# Patient Record
Sex: Female | Born: 1940 | Race: White | Hispanic: No | Smoking: Never smoker
Health system: Southern US, Academic
[De-identification: ages and names within clinical notes are randomized; demographics above are authoritative.]

## PROBLEM LIST (undated history)

## (undated) DIAGNOSIS — S72142A Displaced intertrochanteric fracture of left femur, initial encounter for closed fracture: Secondary | ICD-10-CM

## (undated) DIAGNOSIS — F419 Anxiety disorder, unspecified: Secondary | ICD-10-CM

## (undated) DIAGNOSIS — E7800 Pure hypercholesterolemia, unspecified: Secondary | ICD-10-CM

## (undated) DIAGNOSIS — E559 Vitamin D deficiency, unspecified: Secondary | ICD-10-CM

## (undated) DIAGNOSIS — I10 Essential (primary) hypertension: Secondary | ICD-10-CM

## (undated) DIAGNOSIS — J449 Chronic obstructive pulmonary disease, unspecified: Secondary | ICD-10-CM

## (undated) DIAGNOSIS — I499 Cardiac arrhythmia, unspecified: Secondary | ICD-10-CM

## (undated) HISTORY — DX: Hypomagnesemia: E83.42

## (undated) HISTORY — PX: BRONCHOSCOPY: SUR163

## (undated) HISTORY — PX: HX HIP REPLACEMENT: SHX124

## (undated) HISTORY — PX: HX CYSTOCELE REPAIR: SHX163

## (undated) HISTORY — DX: Vitamin D deficiency, unspecified: E55.9

## (undated) HISTORY — DX: Displaced intertrochanteric fracture of left femur, initial encounter for closed fracture: S72.142A

## (undated) HISTORY — PX: HX CARPAL TUNNEL RELEASE: SHX101

---

## 1999-04-28 ENCOUNTER — Other Ambulatory Visit (HOSPITAL_COMMUNITY): Payer: Self-pay | Admitting: OBSTETRICS/GYNECOLOGY

## 2019-09-01 IMAGING — MR MRI LUMBAR SPINE WO CONTRAST
4 of 7 series · 17 of 48 positions shown · non-contrast
Comparison: [id number]

EXAM: MRI LUMBAR SPINE WITHOUT CONTRAST.
INDICATION: Radiculopathy, back pain.
TECHNIQUE: MR imaging of the lumbar spine was performed without contrast.

[Series 401: T2 · sagittal · 4.0mm · 0.50mm/px · 5 of 15 slices shown (1 of 3)]
[im 1/15]
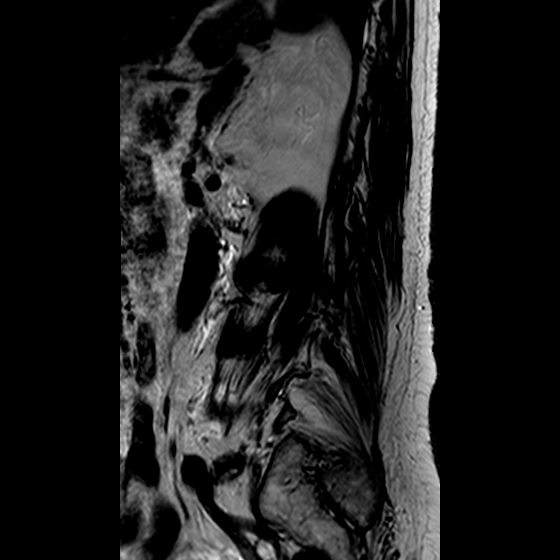
[im 4/15]
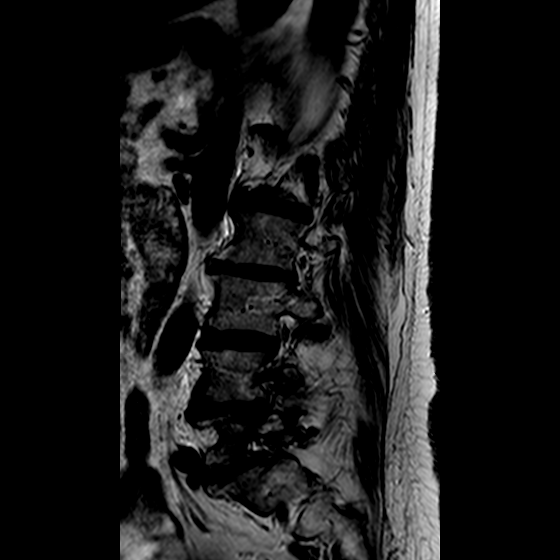
[im 8/15]
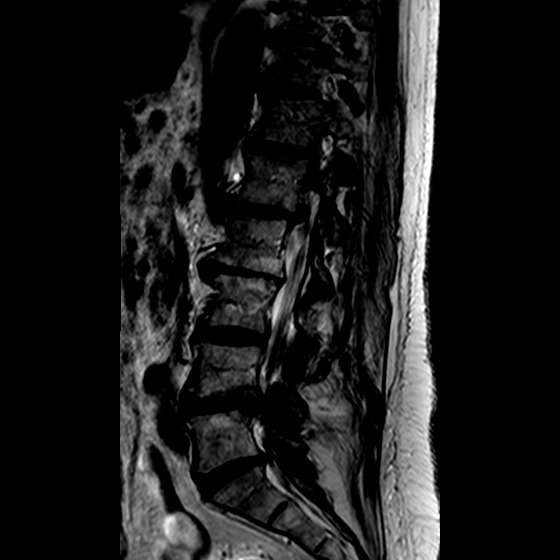
[im 11/15]
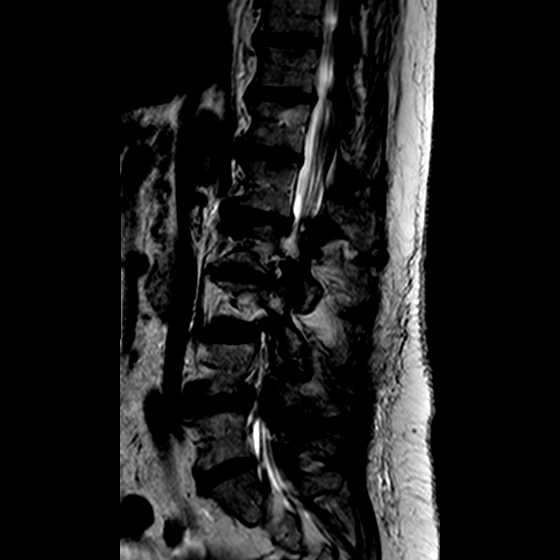
[im 15/15]
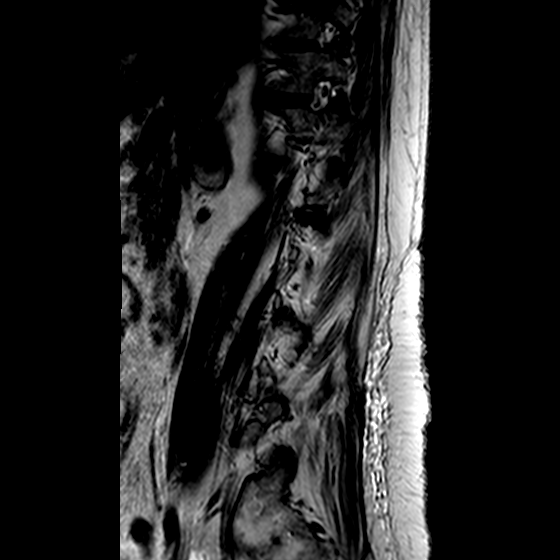

[Series 501: T1 · sagittal · 4.0mm · 0.49mm/px · 3 of 15 slices shown]
[im 1/15]
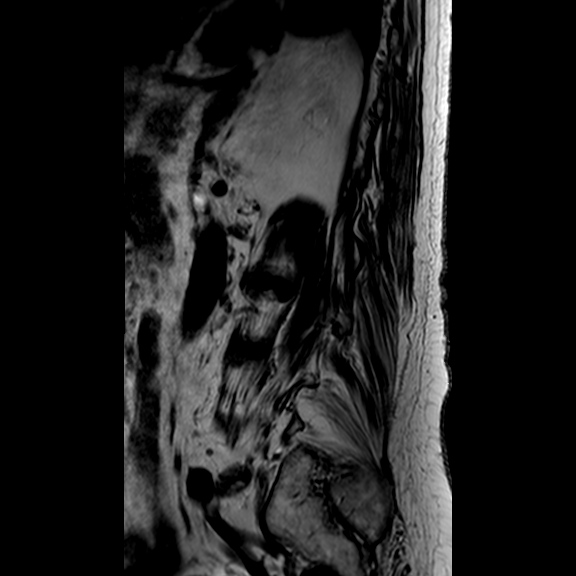
[im 8/15]
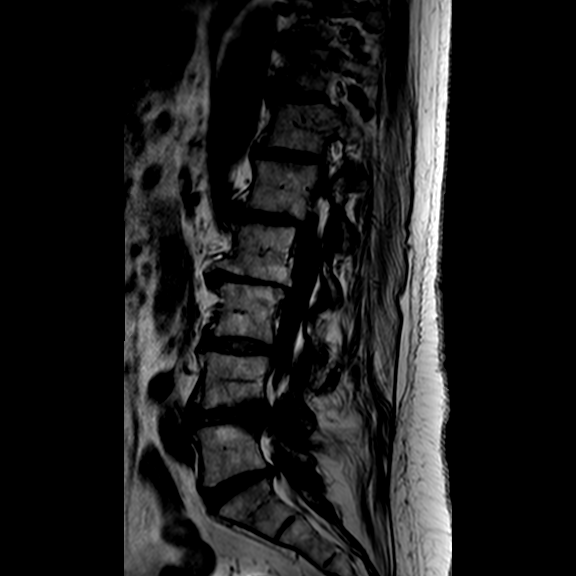
[im 15/15]
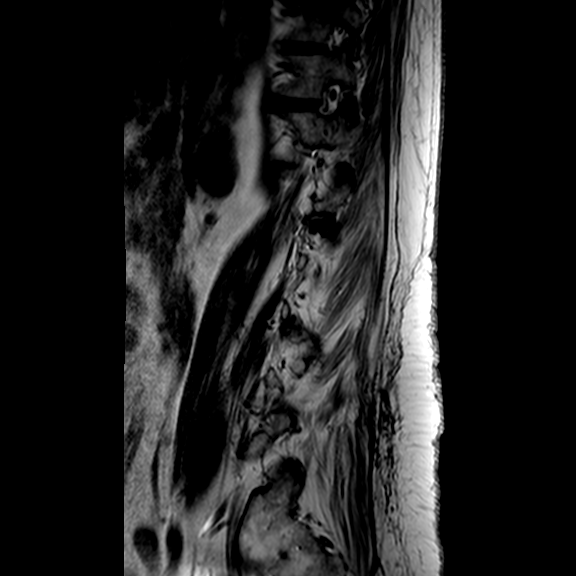

[Series 701: T2 · coronal · 4.0mm · 0.49mm/px · 6 of 19 slices shown (2 of 3)]
[im 1/19]
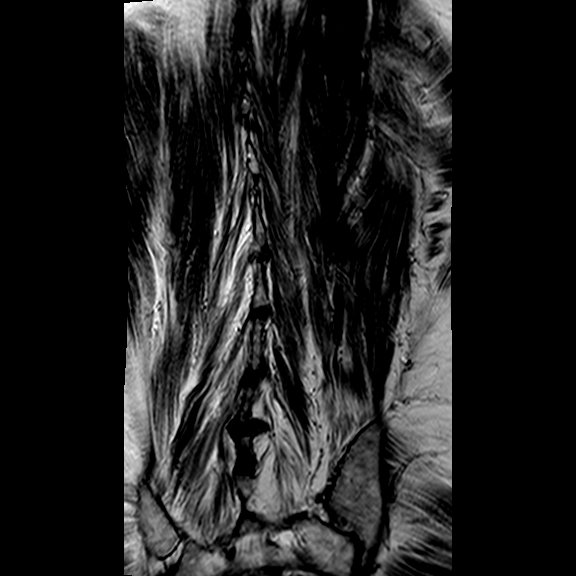
[im 4/19]
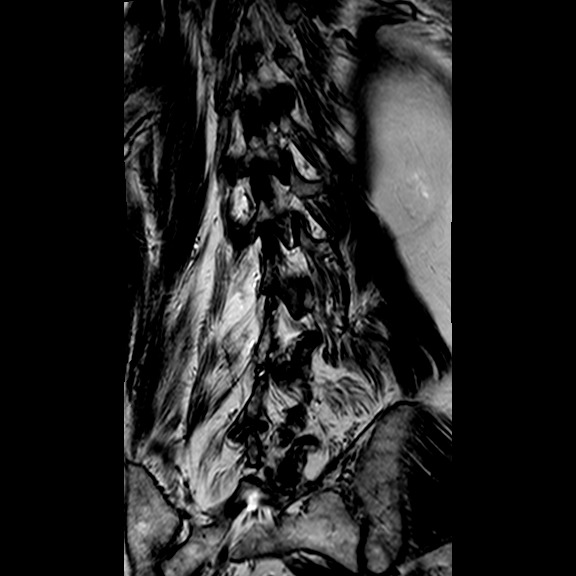
[im 7/19]
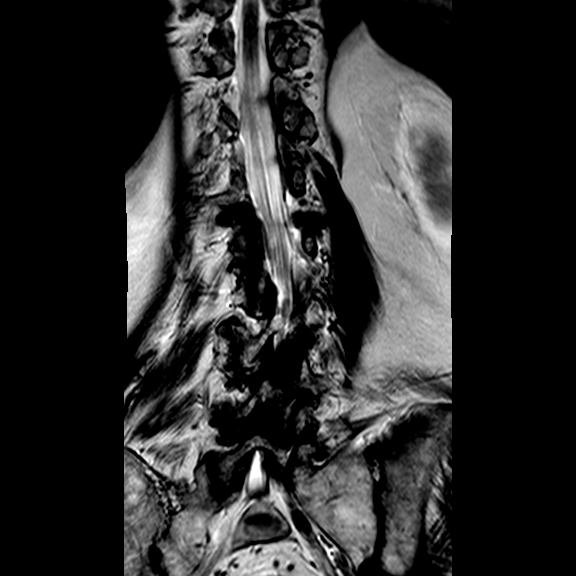
[im 10/19]
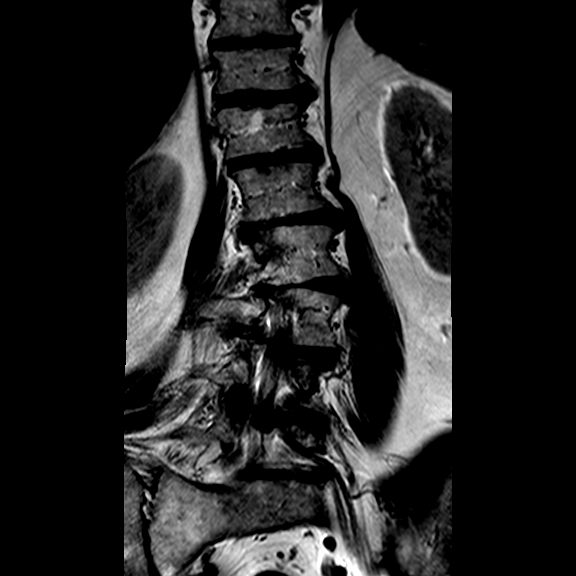
[im 13/19]
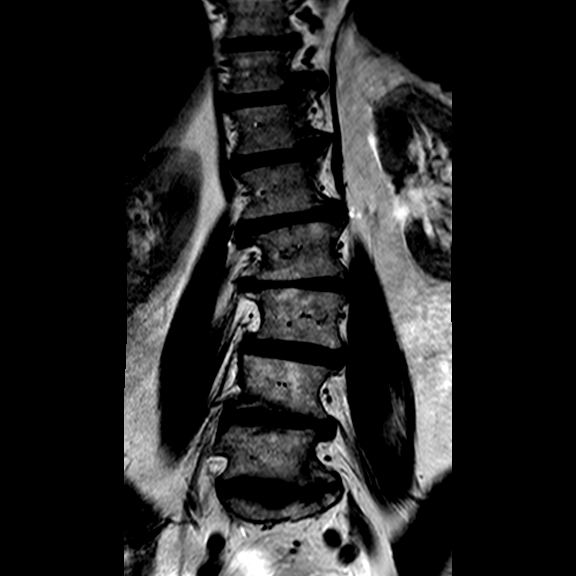
[im 16/19]
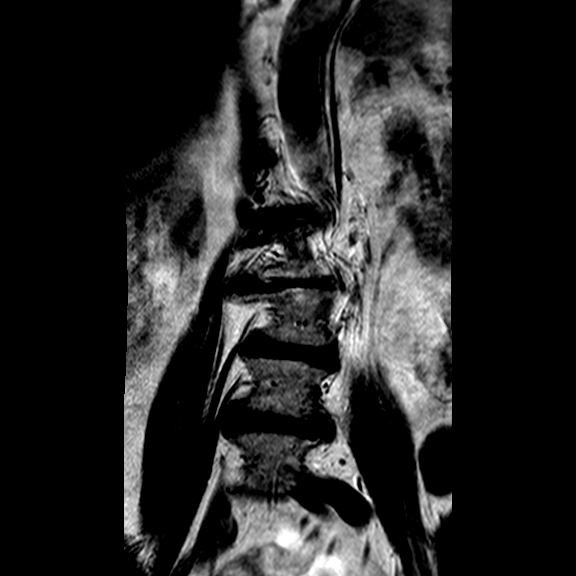

[Series 801: T2 · axial · 4.0mm · 0.49mm/px · z∈[-124,+27]mm · 3 of 30 slices shown (3 of 3)]
[im 3/30]
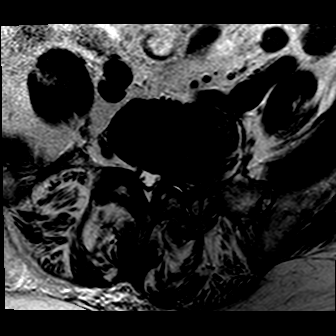
[im 15/30]
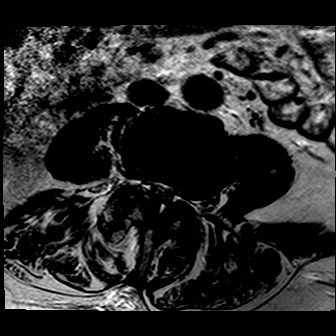
[im 27/30]
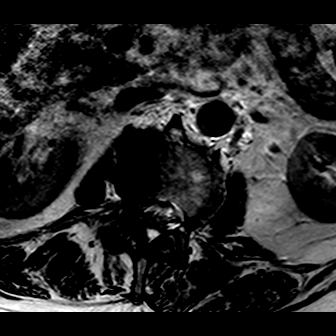

[17 of 48 positions shown; findings below may reference images not displayed]

FINDINGS: For the purposes of this report, the most caudal well-formed disc space is designated as L5-S1.
There is moderate levorotatory scoliosis centered at L2. Mild to moderate exaggeration of lordosis with 3 mm retrolisthesis of L4 on L5.  Vertebral body heights are preserved. There is marrow edema along the right L4-L5 dorsal endplates and left L4 and L5 pedicles, in keeping with type I marrow degenerative changes.
The conus is normal in signal and caliber, terminating at T12-L1.
L4-L5 Baastrup's disease changes are present.
T10-L1: Only visualized on sagittal views with no spinal canal stenosis. There is moderate lower thoracic facet arthropathy with mild left neural foraminal narrowing from T10-T12.
L1-L2: Moderate disc height loss with osteophytosis and moderate facet arthropathy. Mild to moderate spinal canal stenosis. Moderate right foraminal narrowing.
T12-L1: Moderate disc height loss with osteophytosis and mild to moderate spinal canal stenosis. Mild facet arthropathy. Severe right and mild left foraminal narrowing.
L3-L4: Mild disc height loss with bulging of the disc and severe hypertrophic facet arthropathy. Moderate spinal canal stenosis. Severe right and mild to moderate left foraminal narrowing.
L4-L5: Moderate disc height loss with osteophytosis and a superimposed central disc extrusion with associated 9 mm caudal extension. Severe spinal canal stenosis. Moderate hypertrophic facet arthropathy. Severe bilateral foraminal narrowing.
L5-S1: Moderate disc height loss with osteophytosis asymmetric to the left. Severe far lateral left foraminal narrowing. Moderate to severe left subarticular narrowing and mild right subarticular narrowing. No spinal canal stenosis. Mild facet arthropathy.
IMPRESSION: 1.  Scoliosis, hyperlordosis, and multilevel spondylosis with severe spinal canal stenosis at L4-L5.
2.  Multilevel advanced lateralizing stenosis.
LOCATION CODE: 1

## 2019-10-02 IMAGING — CT CT CHEST PULMONARY EMBOLISM W IV CONTRAST
2 of 5 series · 13 of 36 positions shown · non-contrast
Comparison: none

CTA OF THE CHEST PULMONARY EMBOLUS PROTOCOL:
CLINICAL INDICATION:  PE suspected, intermediate prob, positive D-dimer
REFERENCE:  None
TECHNIQUE: Informed consent was obtained for the administration of contrast.  Following the timed administration of intravenous contrast, axial imaging was obtained through the central portion of the chest to include the pulmonary arteries.  Additional delayed full chest imaging was then obtained.  Subsequent coronal slab MIP reconstruction was obtained.  No untoward contrast reaction was reported.

[Series 2: pe chest · axial · 0.98mm/px · z∈[-269,+1]mm · 10 of 132 slices shown]
[im 12/132  lung]
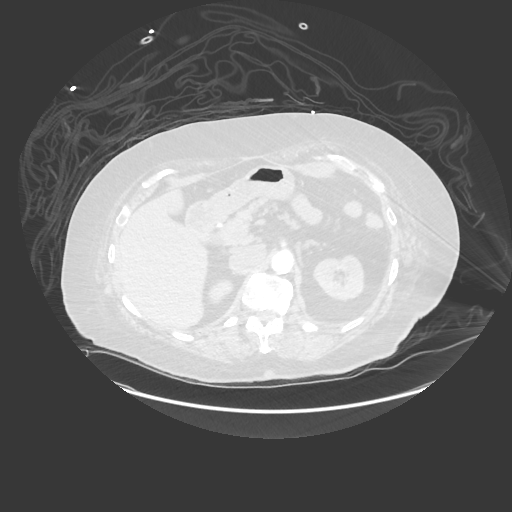
[im 24/132  mediastinal]
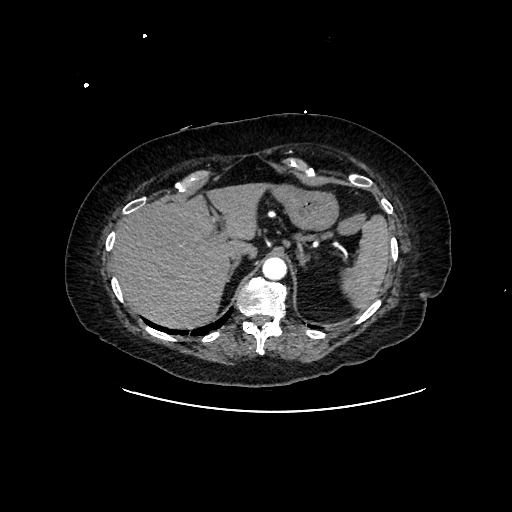
[im 36/132  lung]
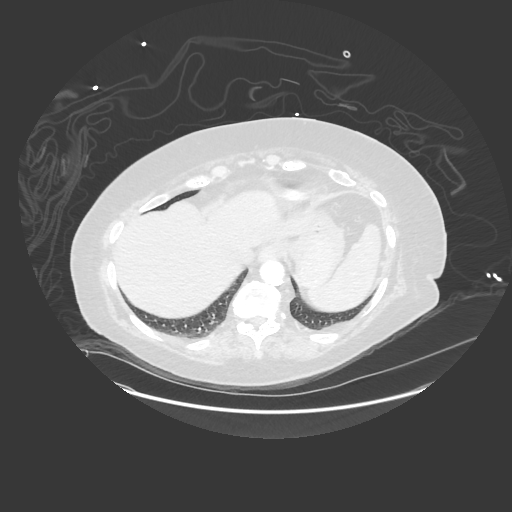
[im 48/132  mediastinal]
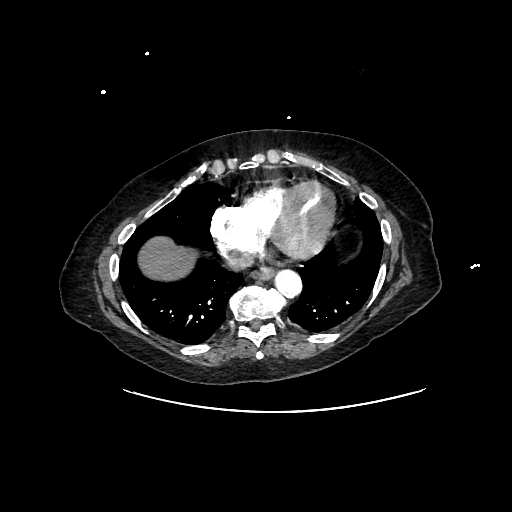
[im 60/132  lung]
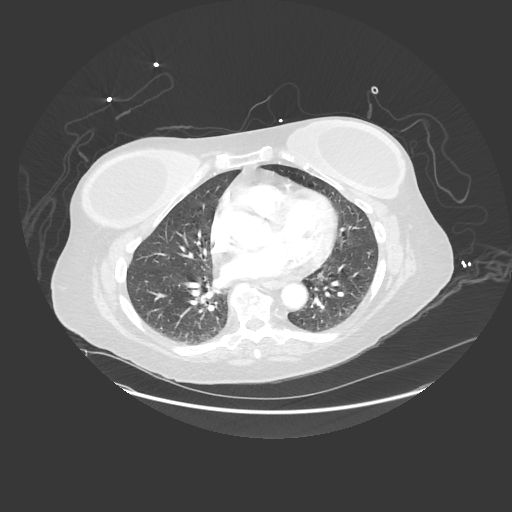
[im 72/132  mediastinal]
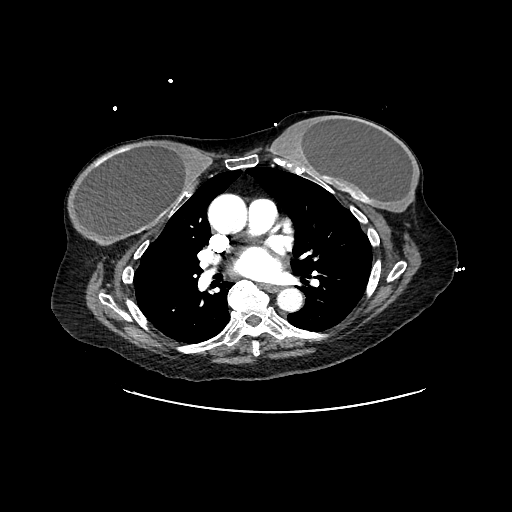
[im 84/132  lung]
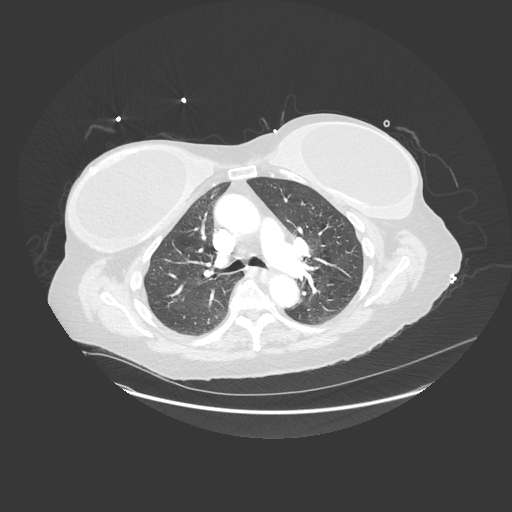
[im 96/132  mediastinal]
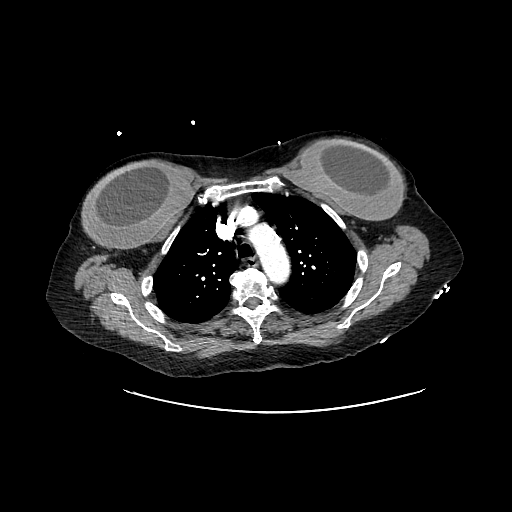
[im 108/132  lung]
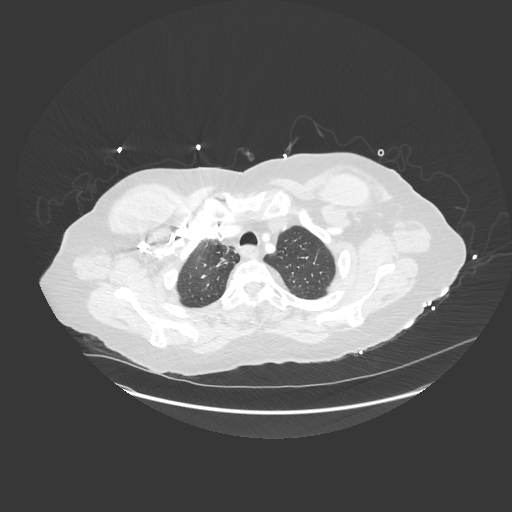
[im 120/132  mediastinal]
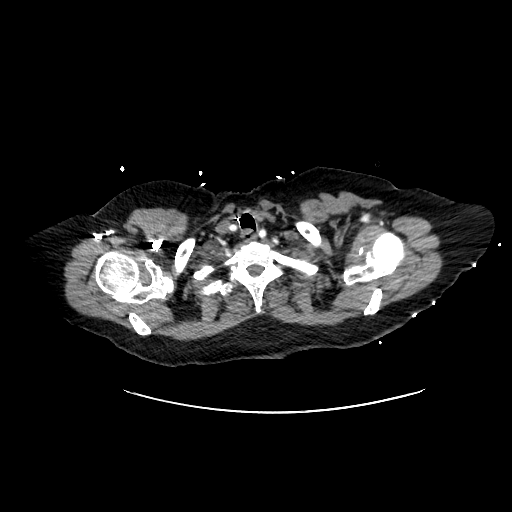

[Series 601: sag standard 2x2 · sagittal · 0.98mm/px · 3 of 241 slices shown]
[im 49/241  mediastinal]
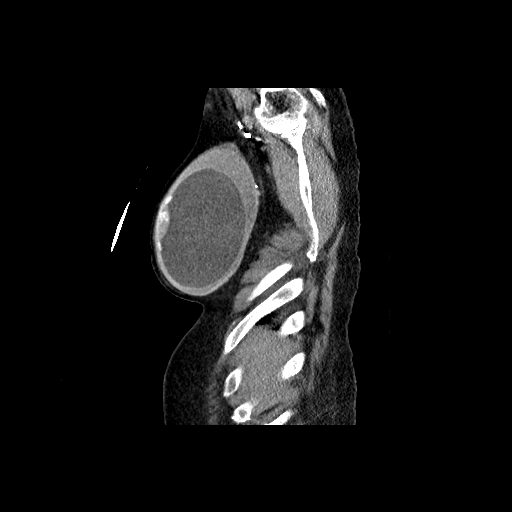
[im 97/241  mediastinal]
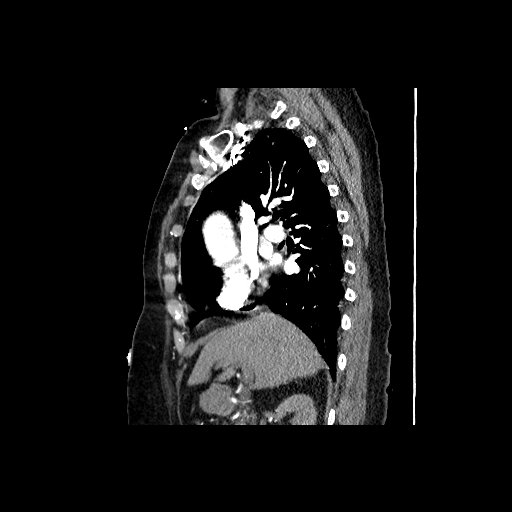
[im 145/241  mediastinal]
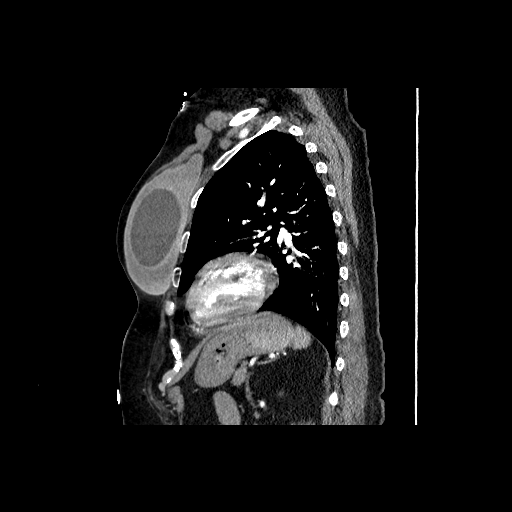

[13 of 36 positions shown; findings below may reference images not displayed]

FINDINGS: There are bilateral breast implants. The heart is normal in size. There is no pericardial effusion. There is minimal aortic mural calcification but no aneurysm or dissection. The pulmonary arteries are normal in caliber. There are no filling defects to suggest the presence of pulmonary arterial emboli.
The left lobe of the thyroid gland appears normal. The right lobe of thyroid gland is surgically absent. The esophagus appears normal. There is no threshold adenopathy in the chest.
Lung windows reveal a patent proximal tracheobronchial tree. There is minimal atelectasis in the lingula. The lungs are otherwise clear with no consolidation, pleural effusion, pneumothorax or suspicious mass.
Limited views of the upper abdomen demonstrate no acute abnormalities. There is a low-attenuation soft tissue density structure posterior to the inferior vena cava measuring 3.6 x 1.8 cm and 29 Hounsfield units, of indeterminate etiology but stable and considered a benign etiology. There is a subcentimeter cyst in the head of the pancreas, stable.
Bone windows reveal multilevel spurs or syndesmophytes.
IMPRESSION: 1. No visible pulmonary arterial embolus.
2. Minimal atelectasis in the lingula.
3. No visible pneumonia, pleural effusion or pneumothorax.
All CT scans at this facility use dose modulation, iterative reconstruction, and/or weight-based dosing when appropriate to reduce radiation dose to as low as reasonably achievable.
LOCATION CODE: 4

## 2019-10-02 IMAGING — DX XR CHEST 1 VIEW
1 series · 1 of 1 positions shown · non-contrast
Comparison: none

SINGLE PORTABLE CHEST:
CLINICAL INDICATION: chest pain
REFERENCE: 03/07/2015

[AP]
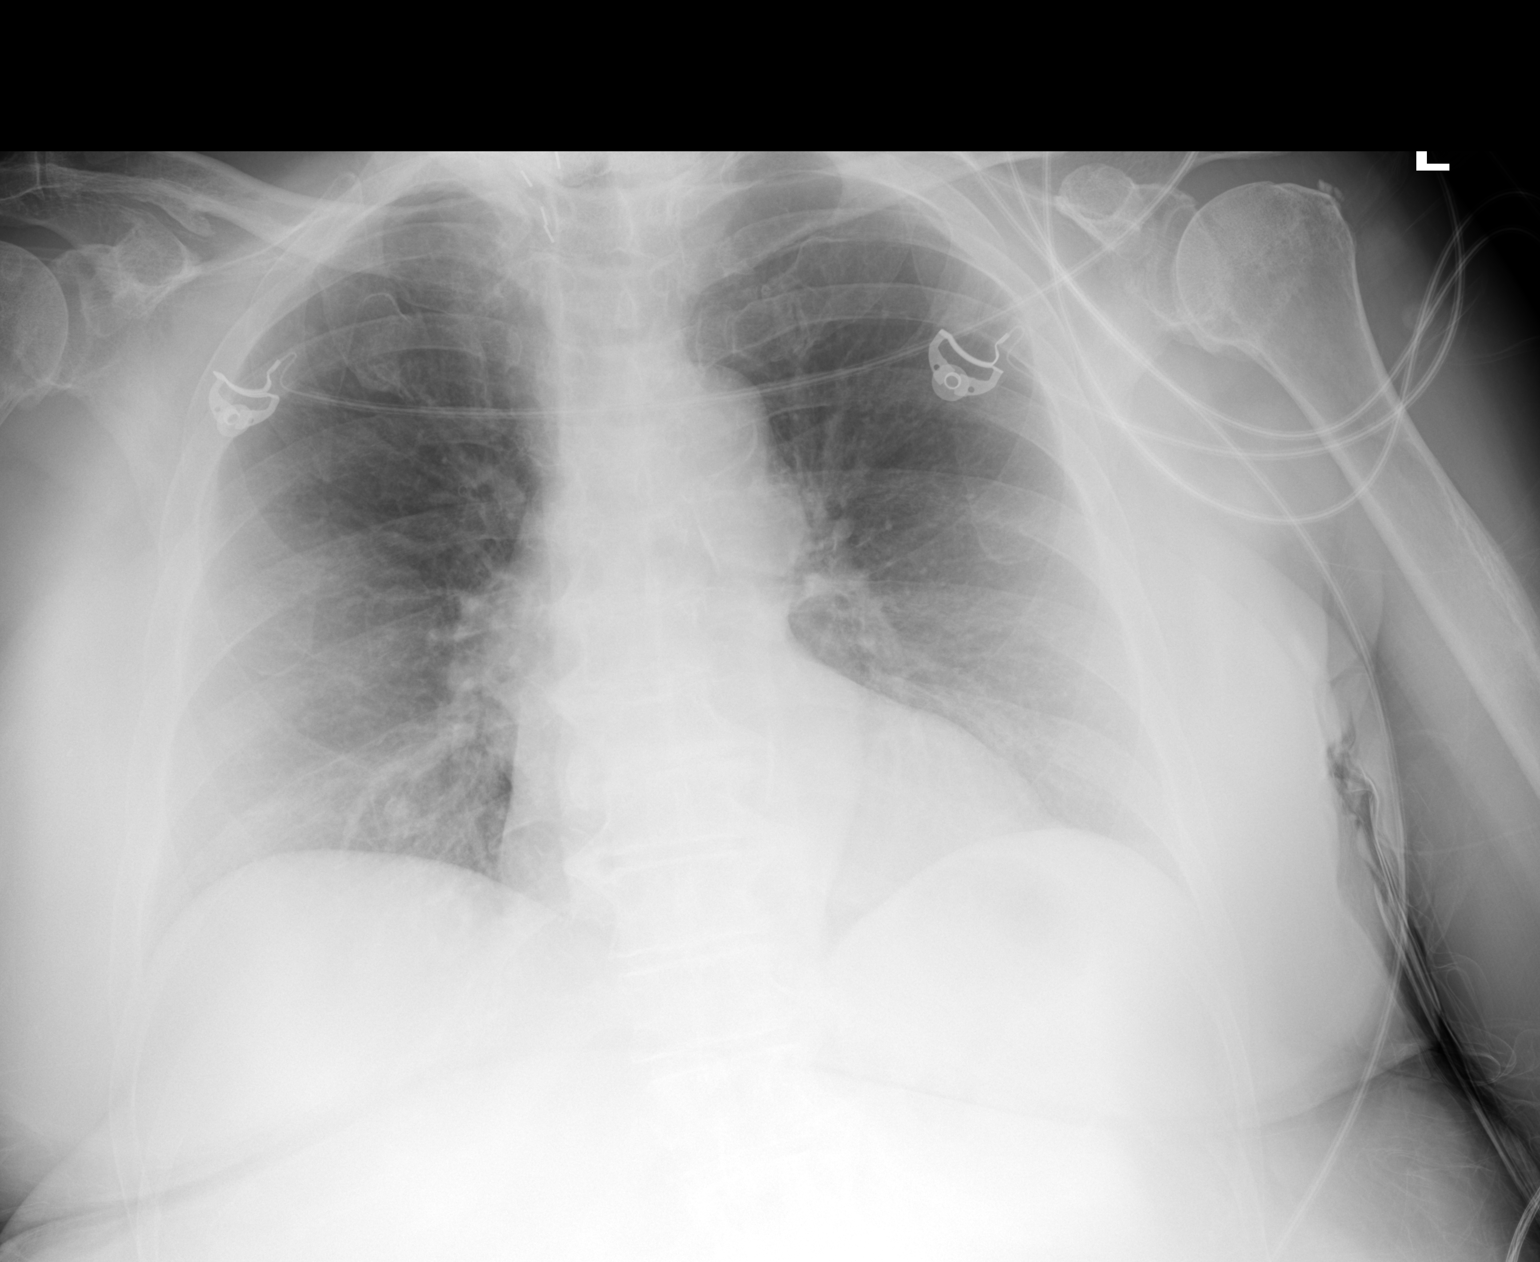

[1 of 1 positions shown; findings below may reference images not displayed]

FINDINGS: The lungs are fully expanded and clear with no consolidation, pleural effusion, pneumothorax or mass.
The heart is normal in size. The pulmonary vascularity is normal. There are no visible mediastinal masses.
There are multilevel small vertebral body spurs or syndesmophytes. There is calcific tendinitis of the left shoulder.
IMPRESSION: No acute cardiopulmonary disease.
LOCATION CODE: 4

## 2019-10-21 IMAGING — CT CT HEAD WO CONTRAST
1 series · 15 of 30 positions shown, 19 images · non-contrast
Comparison: Noncontrast CT brain from 03/07/2015

CT OF THE HEAD WITHOUT CONTRAST
CLINICAL INDICATION: head injury
TECHNIQUE: Multiple axial images were obtained through the brain without the administration of contrast.

[Series 2: head stnd · axial · 0.49mm/px · z∈[-5,+163]mm · 15 of 36 slices shown, 19 images]
[im 2/36  brain]
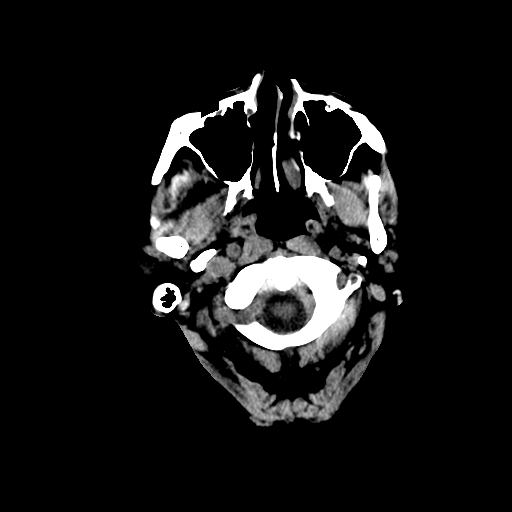
[im 2/36  bone]
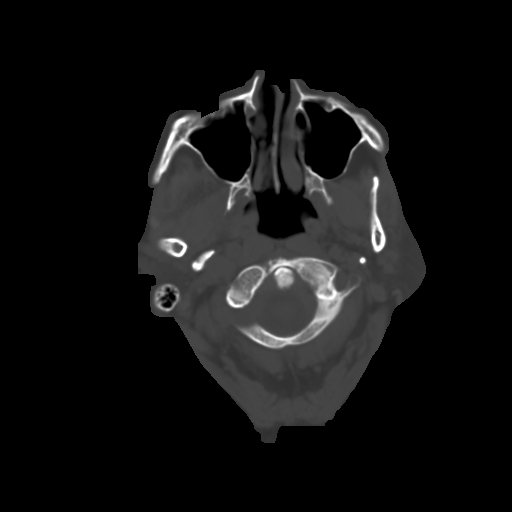
[im 4/36  brain]
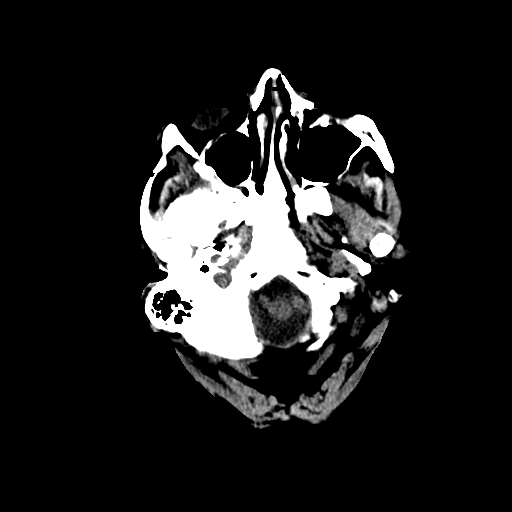
[im 7/36  brain]
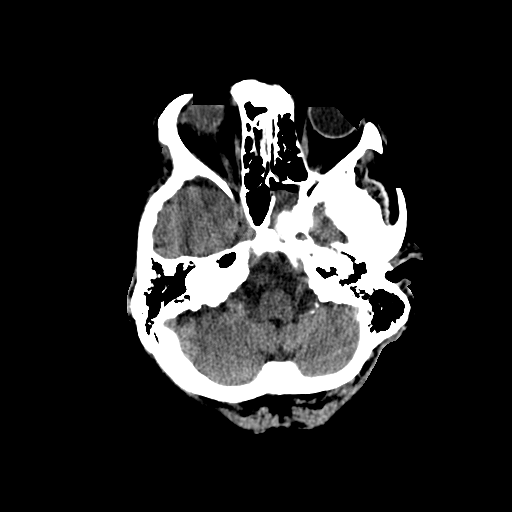
[im 9/36  brain]
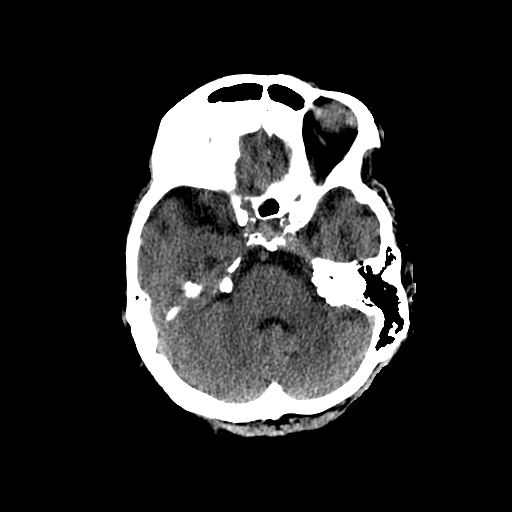
[im 11/36  brain]
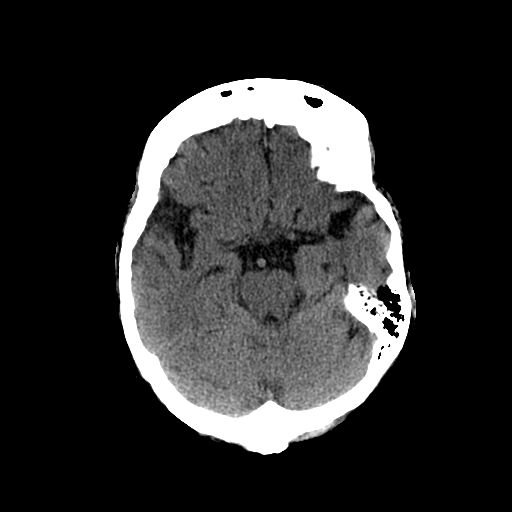
[im 11/36  bone]
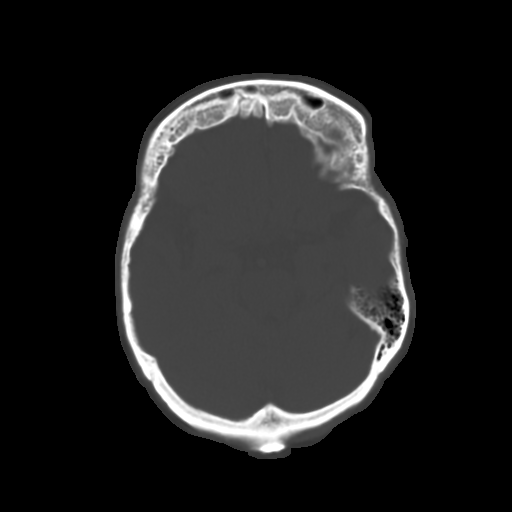
[im 14/36  brain]
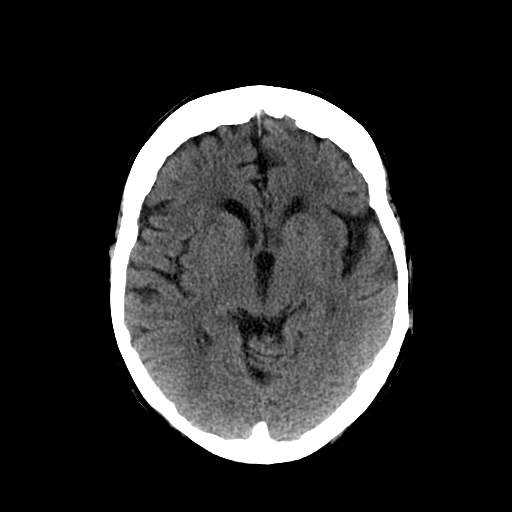
[im 16/36  brain]
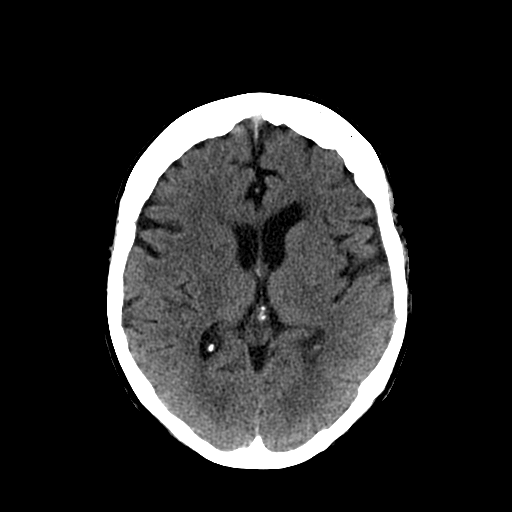
[im 19/36  brain]
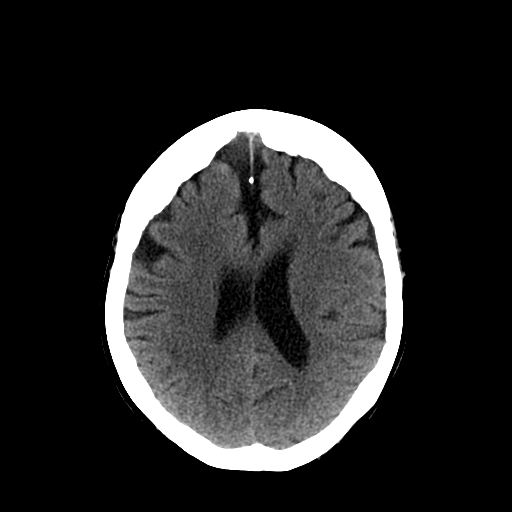
[im 20/36  brain]
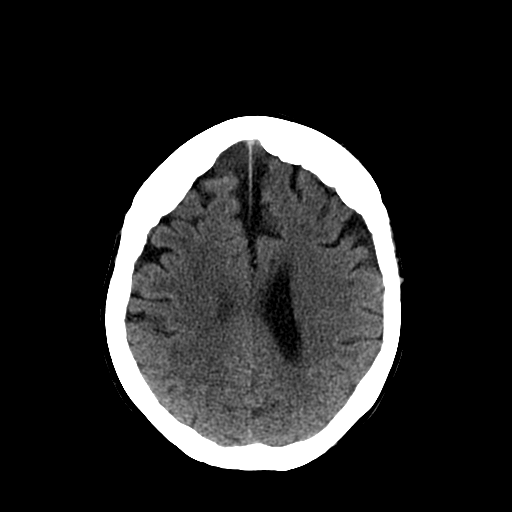
[im 20/36  bone]
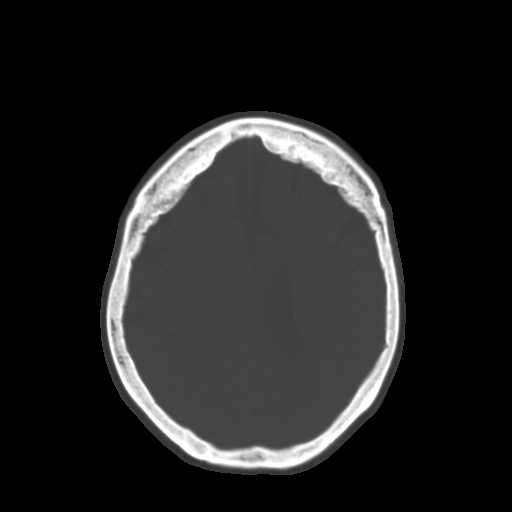
[im 22/36  brain]
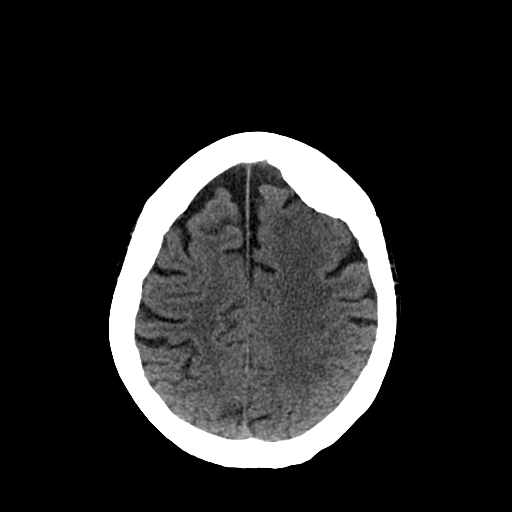
[im 25/36  brain]
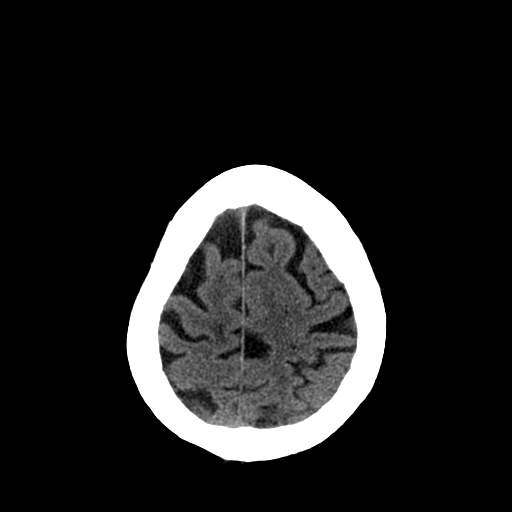
[im 27/36  brain]
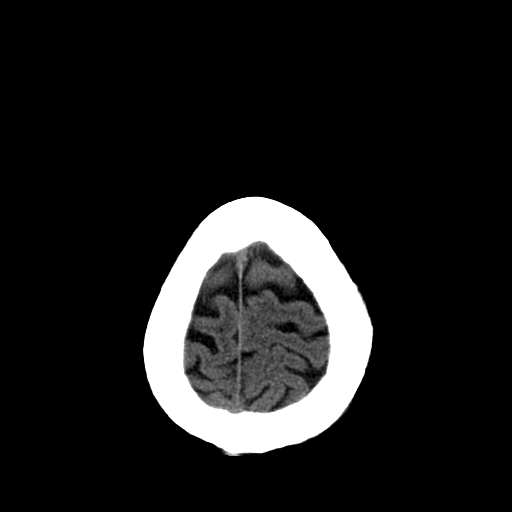
[im 29/36  brain]
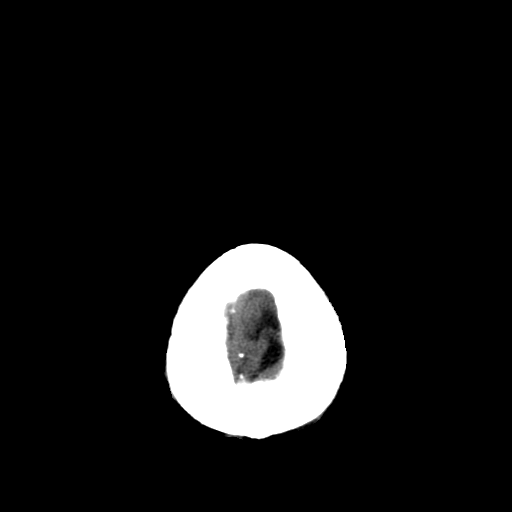
[im 29/36  bone]
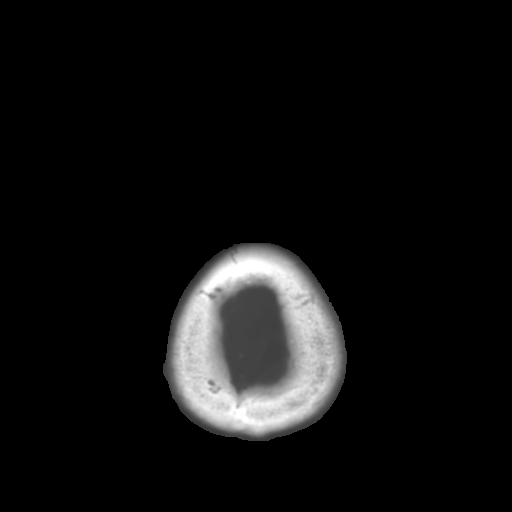
[im 32/36  brain]
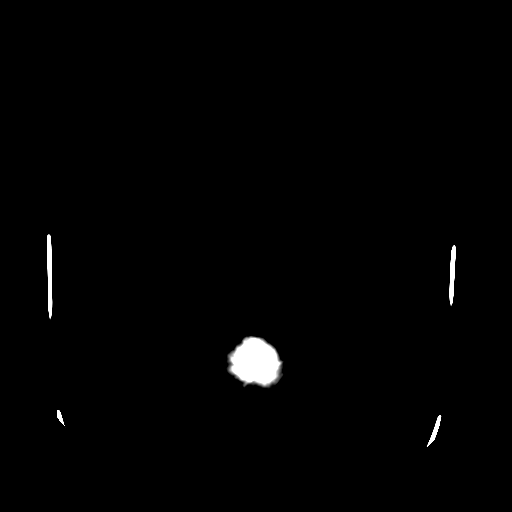
[im 34/36  brain]
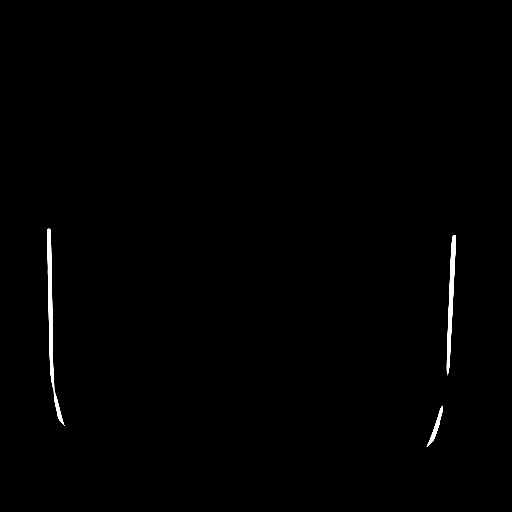

[15 of 30 positions shown; findings below may reference images not displayed]

FINDINGS: EXTRA-AXIAL SPACE: The ventricles appear age appropriate and maintain midline position. No acute collection or mass.
CEREBRUM: Superficial and deep cortical structures are well formed. No focal abnormality or mass effect.
CEREBELLUM: Cerebellar hemispheres and vermis are well formed, without focal abnormality or mass effect. Basal cisterns are maintained.
BRAINSTEM: Midbrain, pons, and medulla are well-formed without mass effect or focal abnormality.
SKULL/EXTRACRANIAL STRUCTURES: Bilateral paranasal sinuses, mastoid air cells, and middle ear cavity are without significant disease. Pituitary fossa and orbits without definite mass. Calvarium, visualized skull base, and remainder of visualized upper neck are unremarkable.
IMPRESSION: No evidence of acute intracranial process.
All CT scans at this facility use dose modulation, iterative reconstruction, and/or weight-based dosing when appropriate to reduce radiation dose to as low as reasonably achievable.
LOCATION CODE: 13

## 2019-10-21 IMAGING — DX XR CHEST 1 VIEW
1 series · 1 of 1 positions shown · non-contrast
Comparison: 10/02/2018
VIEWS: 1

EXAM: SINGLE VIEW CHEST
INDICATION: Shortness of breath

[AP]
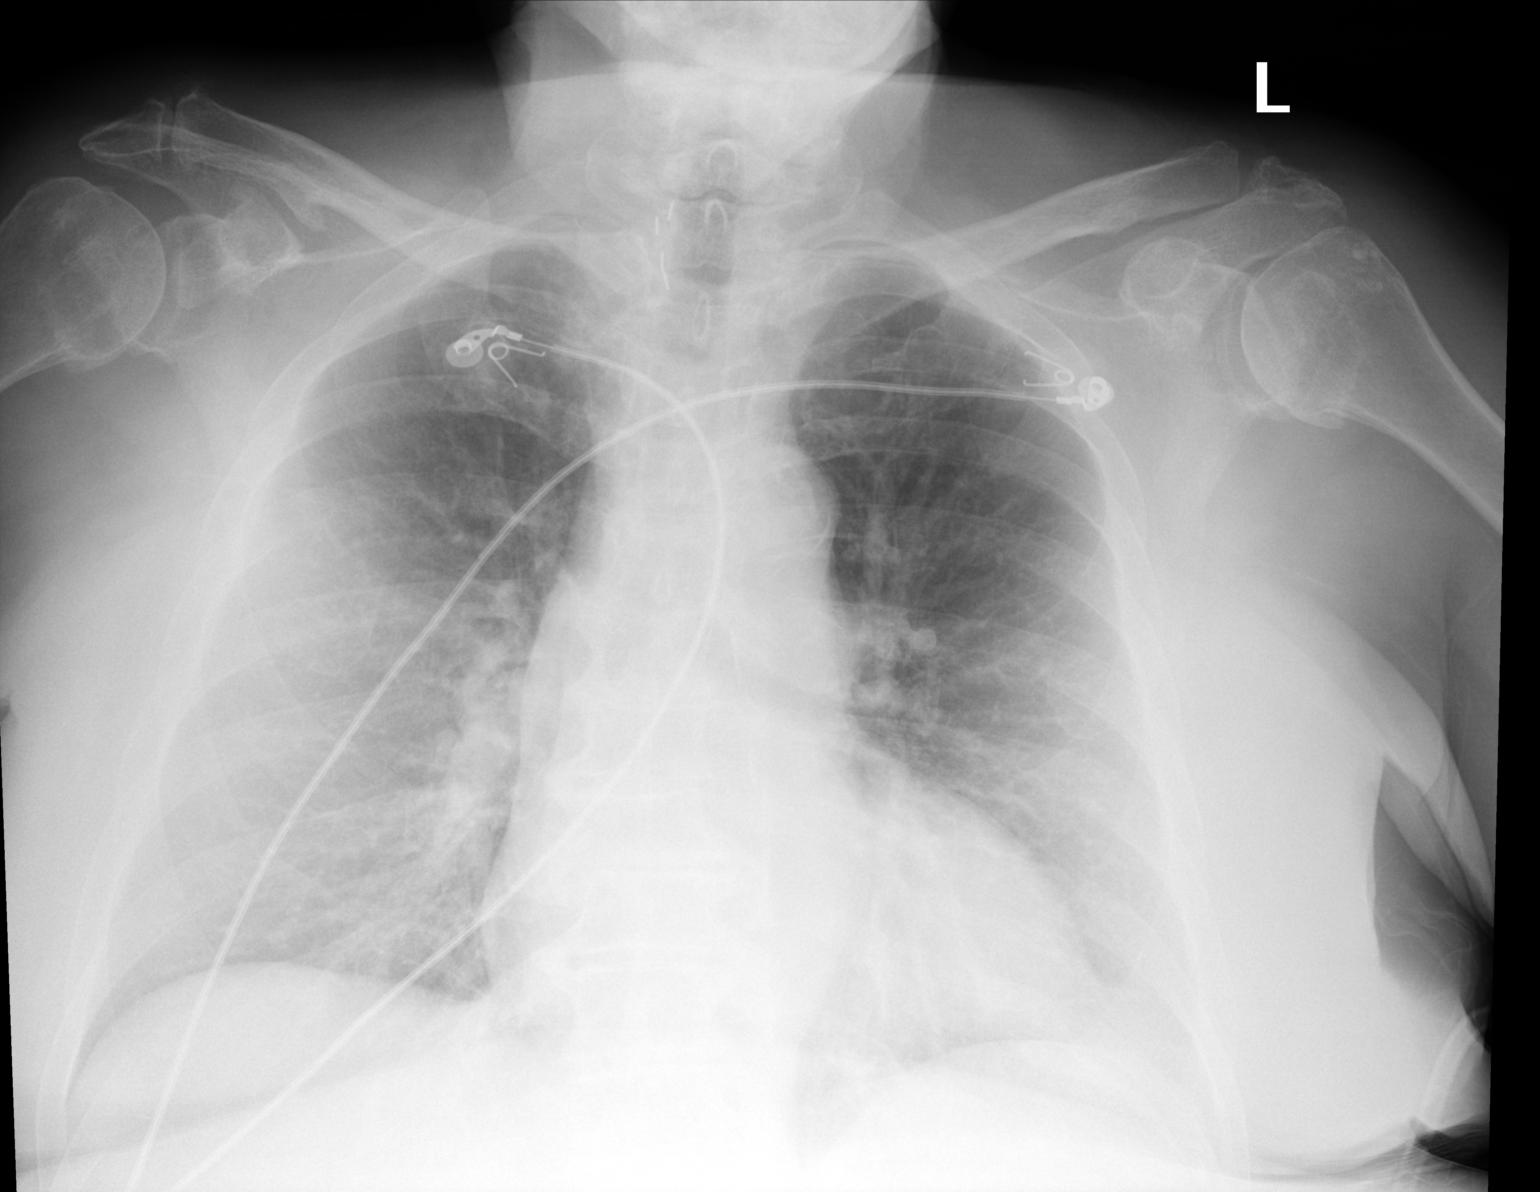

[1 of 1 positions shown; findings below may reference images not displayed]

FINDINGS: LUNGS/PLEURA: Clear. No effusions.
HEART/MEDIASTINUM: No cardiomegaly. No mediastinal or hilar mass.
LIFE SUPPORT LINES: None.
PNEUMOTHORAX: None.
OSSEOUS STRUCTURES: No acute fracture or destructive lesion.
IMPRESSION: No evidence of acute cardiopulmonary process.
LOCATION CODE: 13

## 2019-11-22 IMAGING — MR MRI LUMBAR SPINE W WO CONTRAST
4 of 10 series · 18 of 48 positions shown · IV contrast (agent unspecified)
Comparison: Plain films 11/22/2019. MRI scan 2-21

MRI LUMBAR SPINE W WO CONTRAST
INDICATION: back pain and urinary retention post op
TECHNIQUE: Following informed consent without and with contrast images were obtained

[Series 401: T2 · sagittal · 4.0mm · 0.48mm/px · 3 of 17 slices shown (1 of 2)]
[im 1/17]
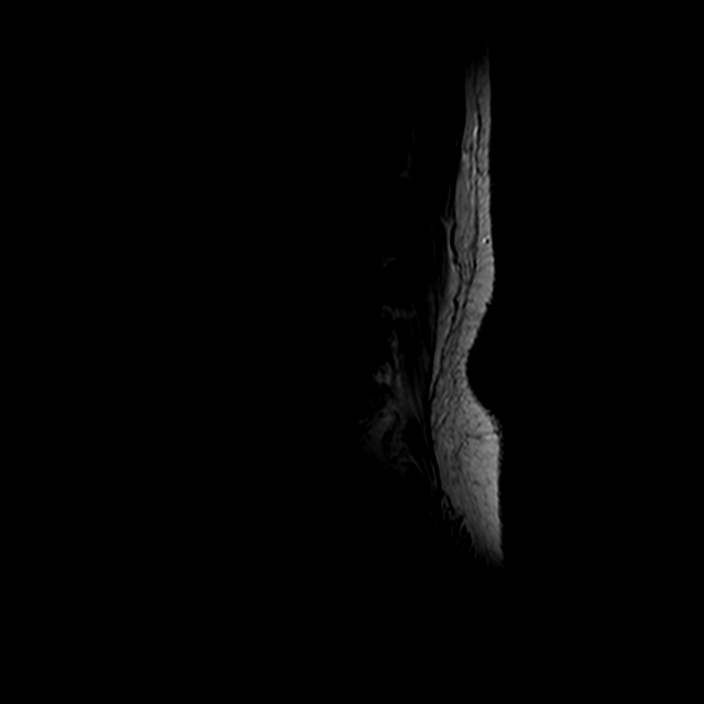
[im 9/17]
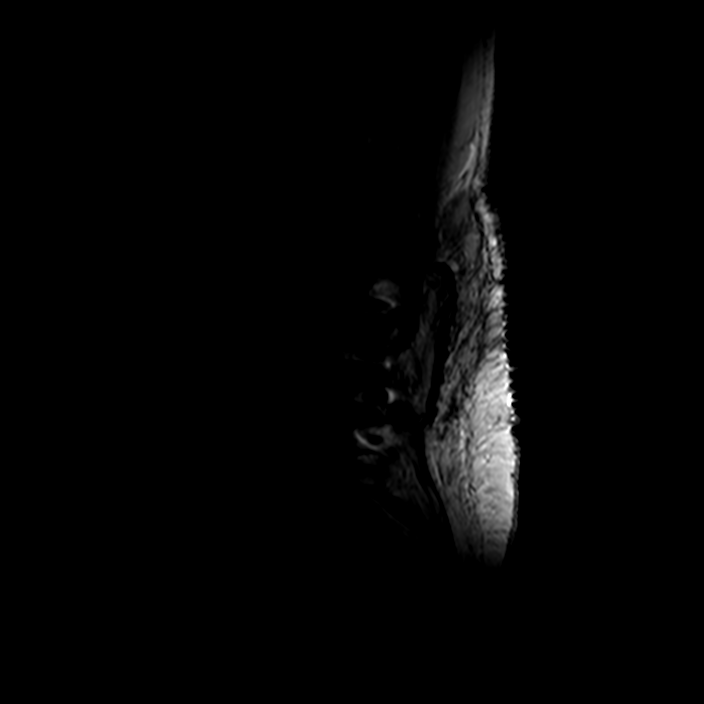
[im 17/17]
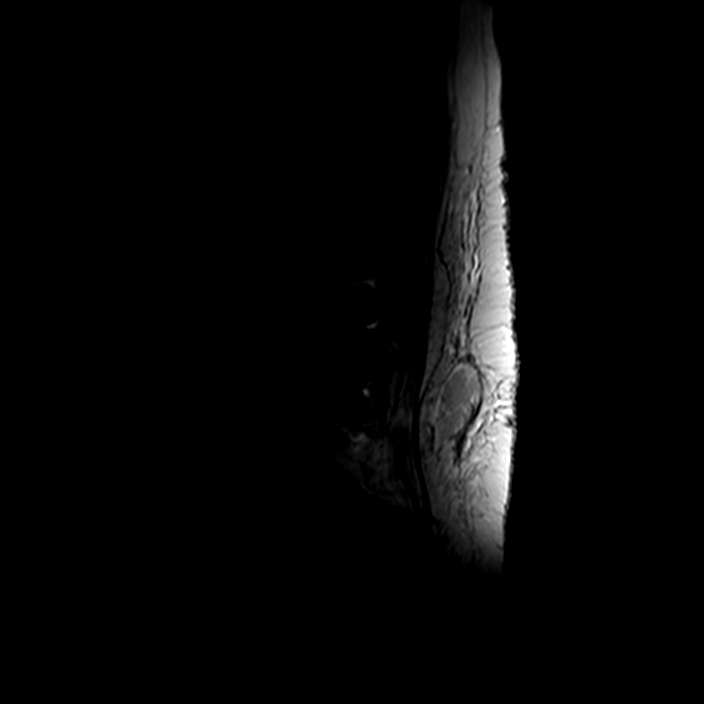

[Series 501: T1 · sagittal · 4.0mm · 0.48mm/px · 3 of 17 slices shown (1 of 2)]
[im 1/17]
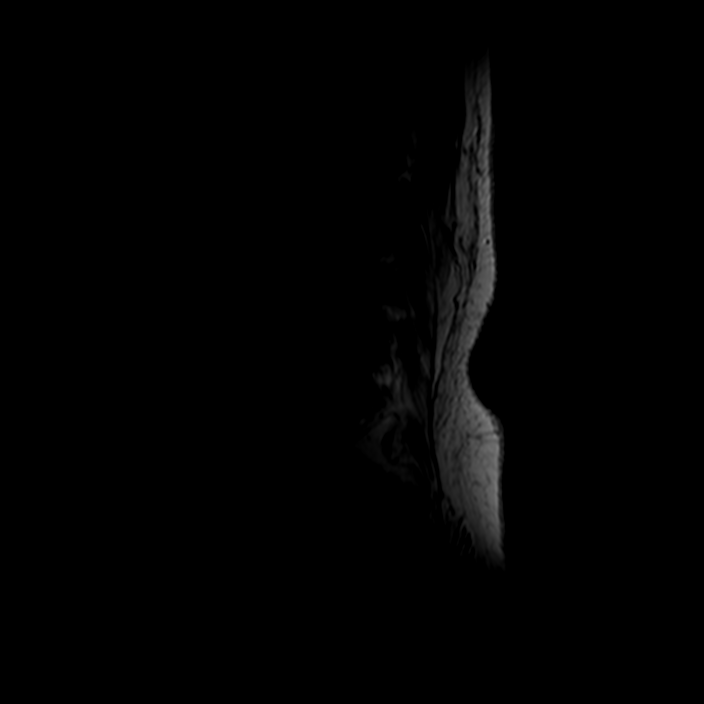
[im 11/17]
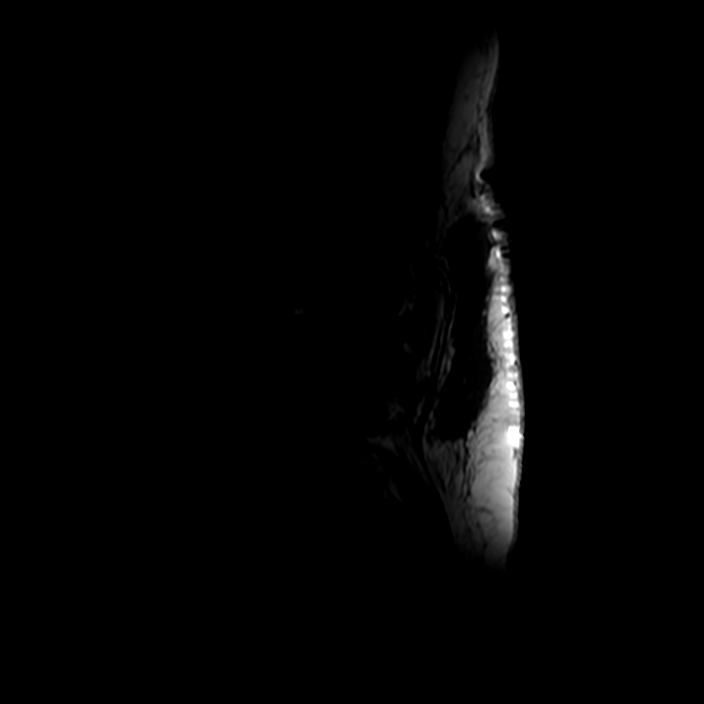
[im 17/17]
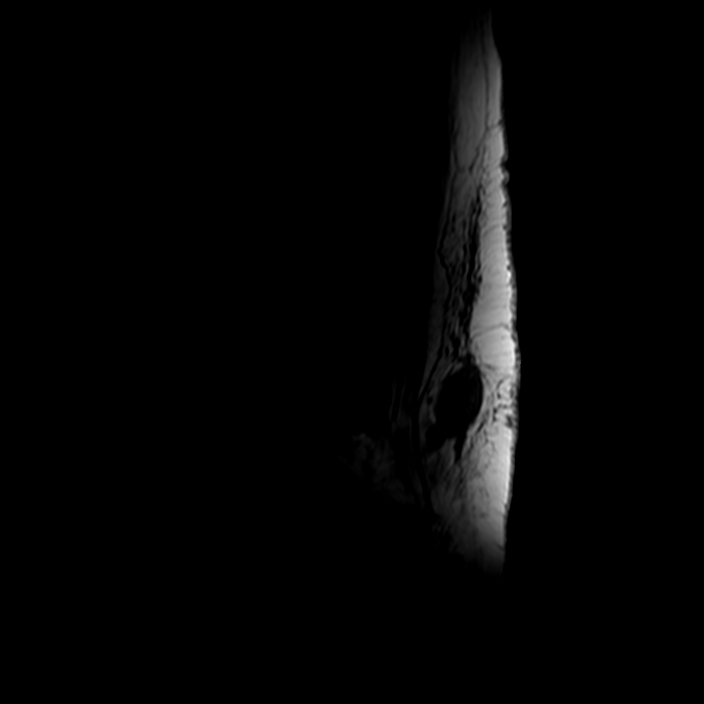

[Series 701: T1 · axial · 4.0mm · 0.48mm/px · z∈[-54,+78]mm · 3 of 40 slices shown (2 of 2)]
[im 5/40]
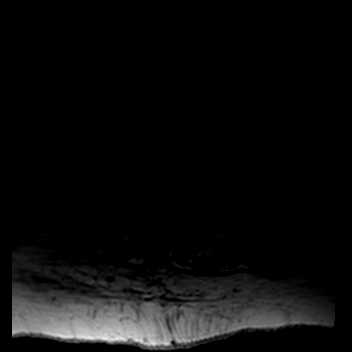
[im 20/40]
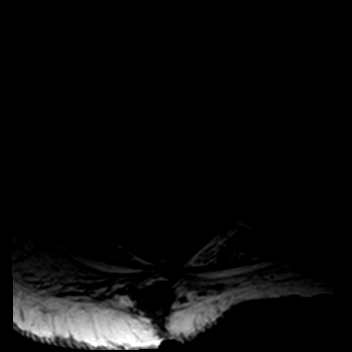
[im 35/40]
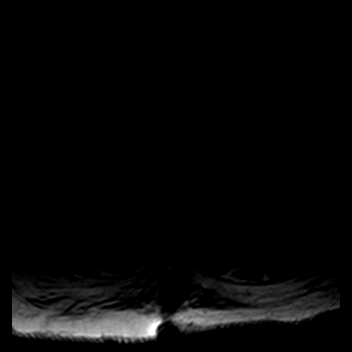

[Series 801: T2 · axial · 4.0mm · 0.48mm/px · z∈[-71,+100]mm · 9 of 40 slices shown (2 of 2)]
[im 1/40]
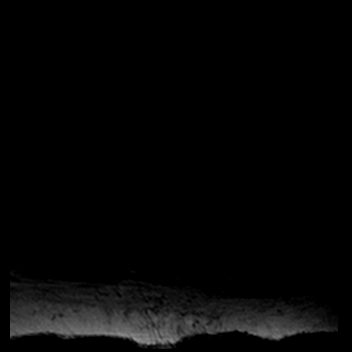
[im 5/40]
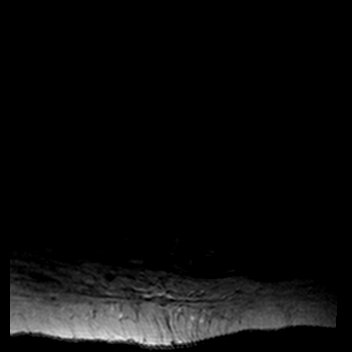
[im 10/40]
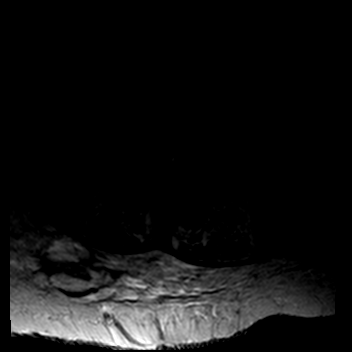
[im 15/40]
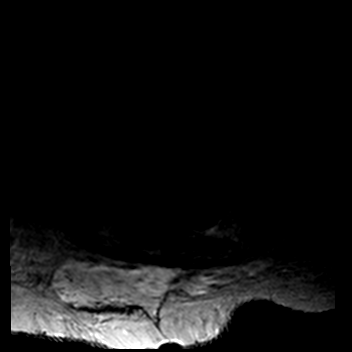
[im 20/40]
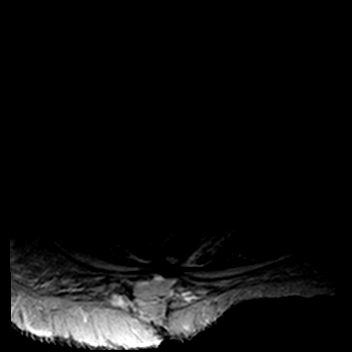
[im 25/40]
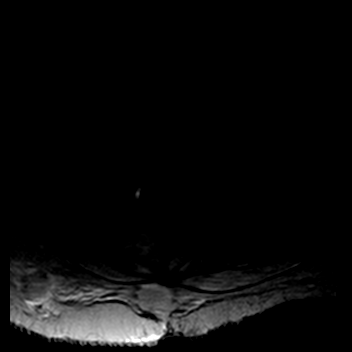
[im 30/40]
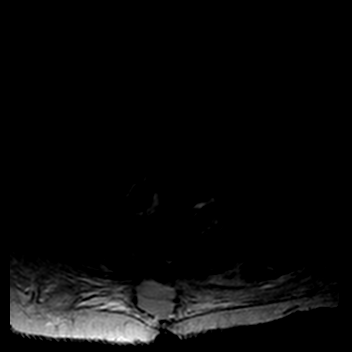
[im 35/40]
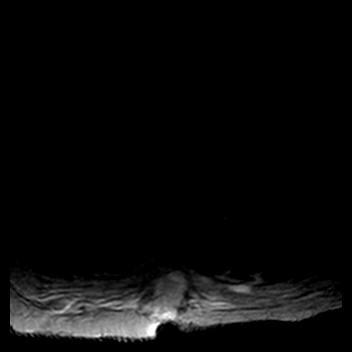
[im 40/40]
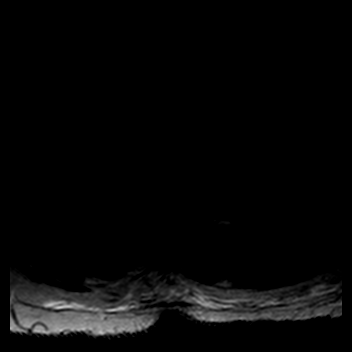

[18 of 48 positions shown; findings below may reference images not displayed]

FINDINGS: Sagittal T1 images demonstrates posterior transpedicular fusion changes L3 L5 and normal anatomic alignment. Wide midline laminectomy changes extending from L3 to L5. Main to the marrow signals normal. The visualized conus CSF and foraminal fat demonstrates no acute abnormality.
Sagittal T2 and STIR images were obtained demonstrating no significant marrow edema. The distal conus is unremarkable. Sagittal T2 images demonstrates compression of the thecal sac above the fusion L1-L2 lesser extent L2-L3. The conus is normal. Below the fusion there is spondylosis and bulging at L5-S1.
Precontrast images were obtained demonstrating spondylosis and bulging at L1-L2 asymmetric to the right with right greater than left facet arthropathy with moderate to severe stenosis to the right encroaching on the right foramen. There is asymmetric bulging right proximal to mid foramen to the right.
L2-3 demonstrates broad-based spondylosis and bulging to the right with right greater than left facet arthropathy with moderate stenosis asymmetric to the right.. In addition there appears be asymmetric fat density causing moderate compression upon the left lateral thecal sac at L2-3 measuring 6.7 mm in greatest transverse dimension.
L3 demonstrates transpedicular screws within the body of L3. L3-4 demonstrates midline laminectomy changes. L4 demonstrates transpedicular screws within the body of L4.
L4-5 demonstrates midline laminectomy changes with what appears to be intervertebral body graft material at L4-5 good position.
L5 demonstrates transpedicular screws within the body of L5.
L5-S1 demonstrates mild central spondylosis and bulging with minimal facet arthropathy with mild central stenosis. There is mild asymmetric spondylosis and bulging left mid to lateral foramen.
The prevertebral and paravertebral spaces unremarkable.
Following contrast repeat images were obtained demonstrating no evidence of abnormal osseous or cord enhancement. Repeat axial images were obtained following contrast demonstrating no evidence of abnormal leptomeningeal or epidural enhancement demonstrated.
IMPRESSION: 
IMPRESSION: Stable post fusion changes L3-L5 with midline laminectomy changes without significant epidural compression or abnormal enhancement.
There is degenerative changes above the fusion particularly at L2-3 with asymmetric left lateral epidural fat causing some compression upon the left lateral thecal sac extending to the proximal left foramen at L2-3.. This appears stable when compared to the MRI scan of 221
See above dictation and please correlate clinically.
Location: 1

## 2019-11-22 IMAGING — DX XR LUMBAR SPINE 2-3 VIEWS
1 series · 3 of 3 positions shown · non-contrast
Comparison: none

Exam: LUMBAR SPINE:
CLINICAL INDICATION:  low back pain
REFERENCE:  None

[AP · U · 0.19mm/px · 3 of 3 slices shown]
[im 1/3]
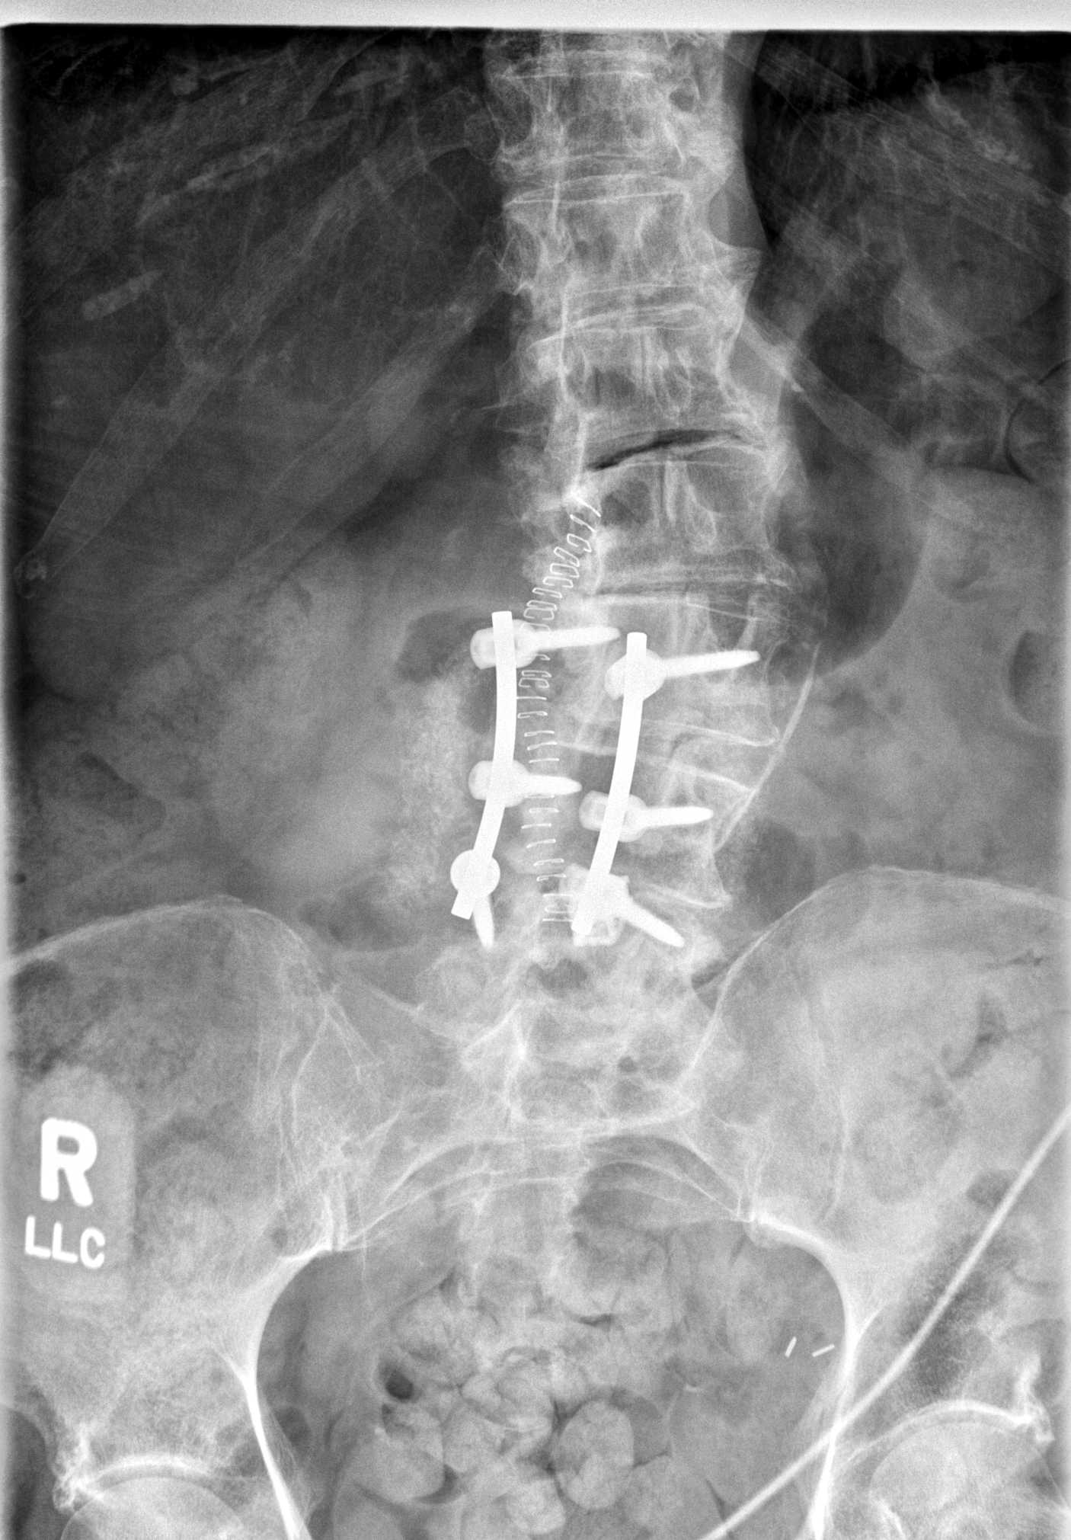
[im 2/3]
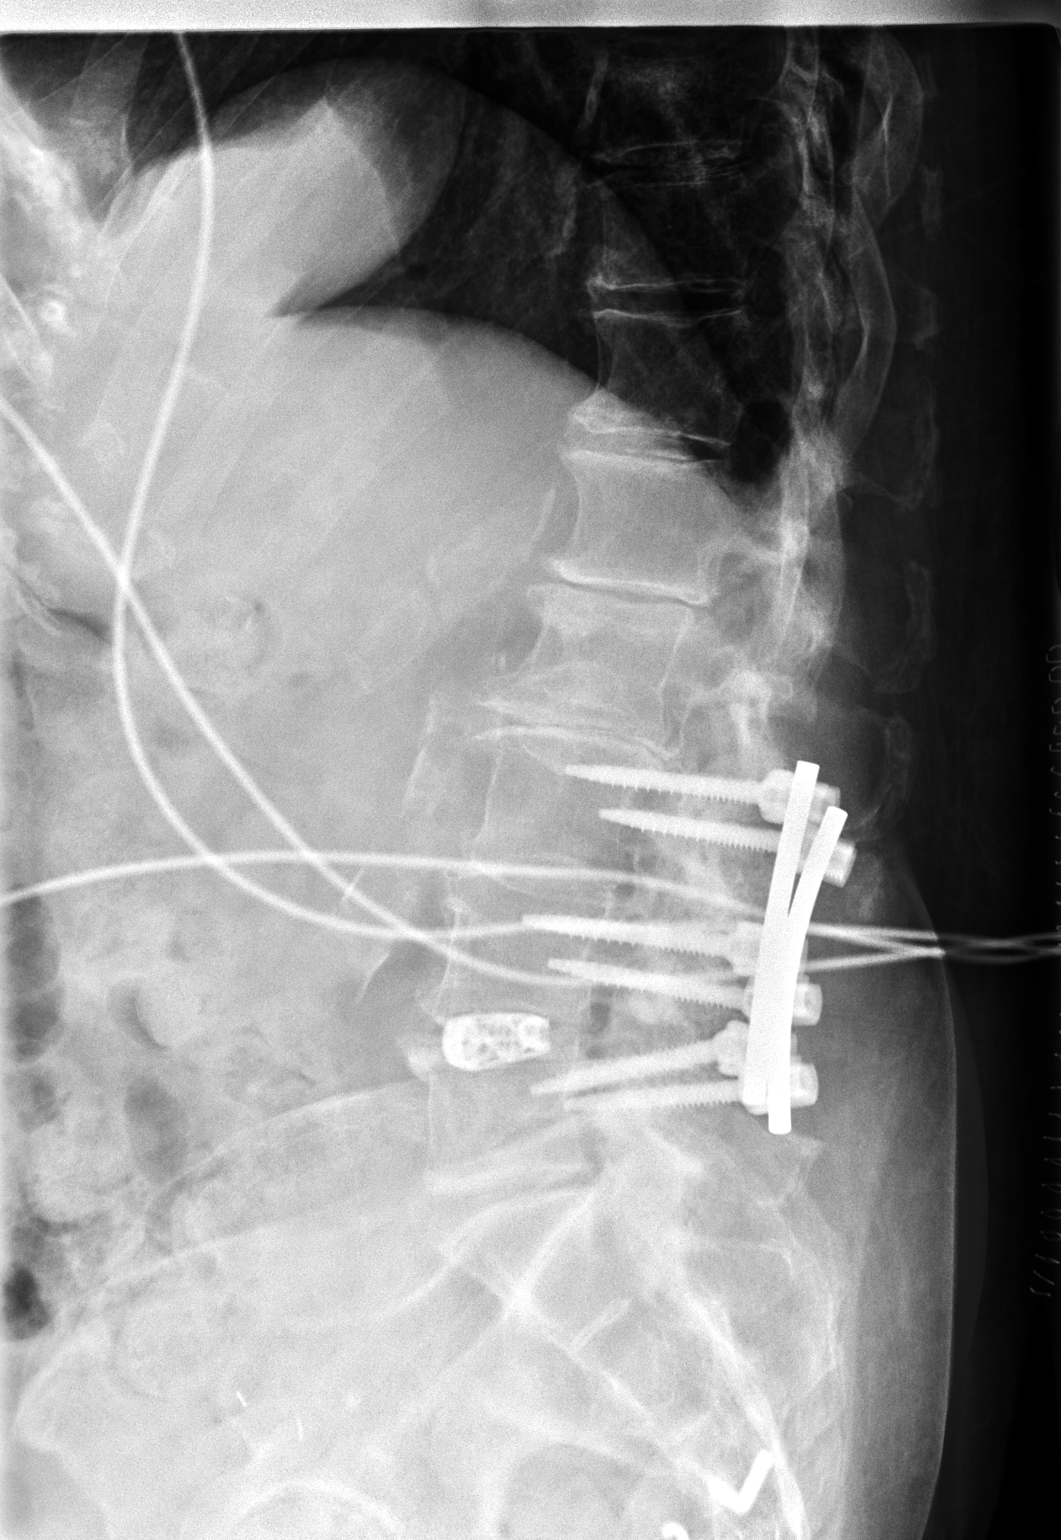
[im 3/3]
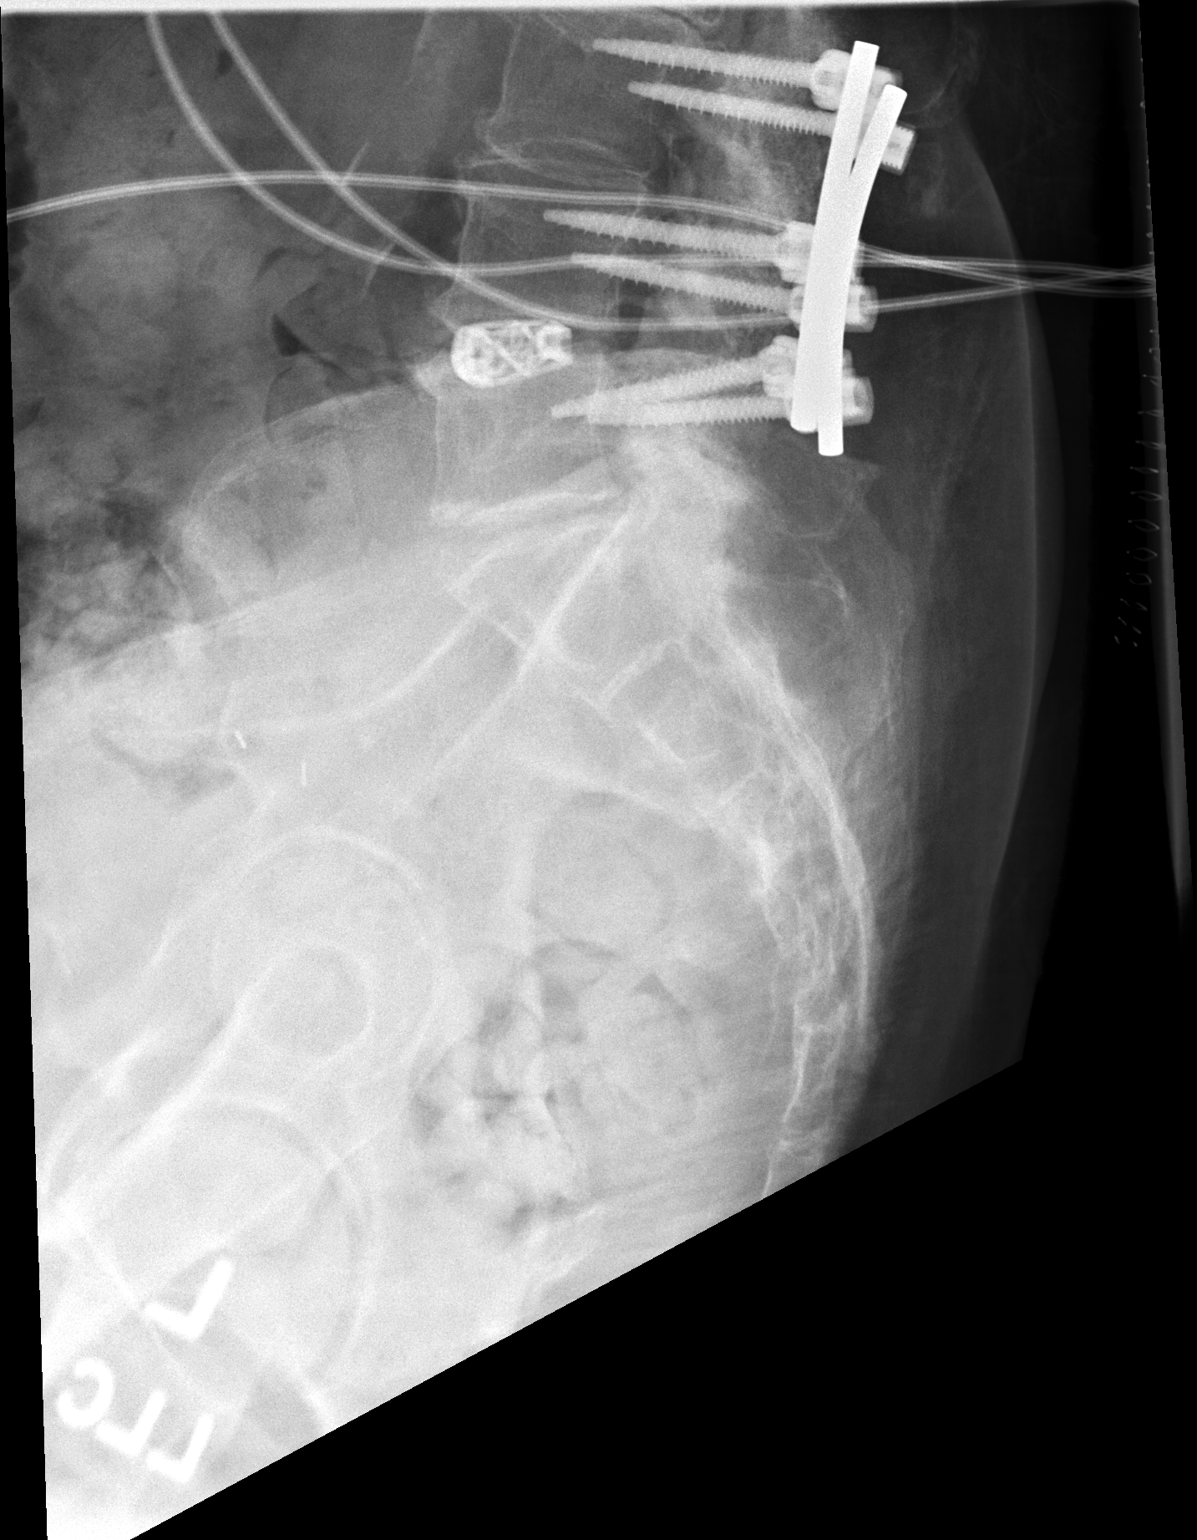

[3 of 3 positions shown; findings below may reference images not displayed]

FINDINGS: 3 views were obtained. Levoscoliosis, likely degenerative with multilevel moderate degenerative disc disease with joint space narrowing and osteophyte formation most severe L2-L3.
Transpedicular screw fixation L3-L5. No surrounding lucency or hardware fracture is suggested.
Prominent at this chronic changes of the adjacent aorta. Skin staples project over the midline.
IMPRESSION: Surgical and degenerative changes as above. No superimposed acute process identified
LOCATION CODE: 10

## 2019-12-02 IMAGING — MR MRI CERVICAL SPINE WO CONTRAST
8 series · 48 of 48 positions shown · non-contrast
Comparison: none

Cervical spine MRI.
Sagittal T1, T2, STIR and axial T2-weighted images of the cervical spine are obtained.
There is normal alignment of the cervical vertebral bodies. There is diffuse narrowing of the cervical canal compatible with mild development canal stenosis.
C1-C2 shows pannus formation without significant canal narrowing or associated bone erosion.
C2-C3 shows disc bulge which along with the ligament flavum hypertrophy is causing canal narrowing which is moderate to severe with canal diameter measuring 6 mm.
C3-C4 shows mild degree of endplate hypertrophy. There is some osteophyte encroachment into the neural foramina with severe narrowing of the left neural foramen C3-C4.
C4-C5 shows disc bulge osteophyte with canal diameter measuring 7 mm in keeping with moderate canal narrowing. There is superimposed left foraminal disc protrusion and osteophyte with severe narrowing of the left foramen C4-C5.
C5-C6 shows disc bulge osteophyte complex. Canal diameter measures 5-6 mm in keeping with severe canal narrowing. Disc osteophyte encroachment is demonstrated into neural foramina bilateral with severe bilateral neural foraminal narrowing.
C7-T1 appears unremarkable.
Spinal cord shows indentation upon ventral spinal cord surface C2-C3, C3-C4, C4-C5, and C5-C6 but no abnormal spinal cord signal intensity.
Visualized posterior fossa structures appear unremarkable. Paraspinal musculature appears unremarkable.

[Series 1: survey · coronal · 1.7mm · 1.67mm/px · 19 of 96 slices shown]
[im 1/96]
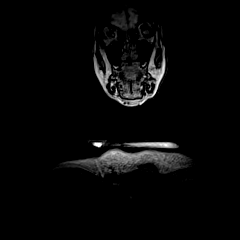
[im 6/96]
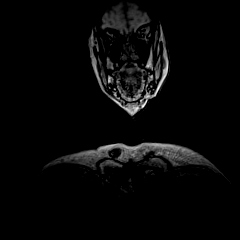
[im 11/96]
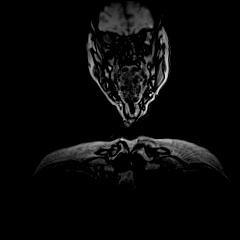
[im 16/96]
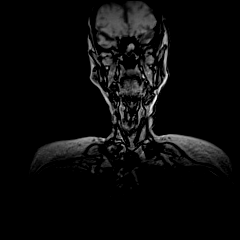
[im 22/96]
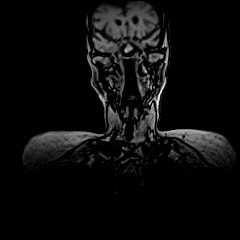
[im 27/96]
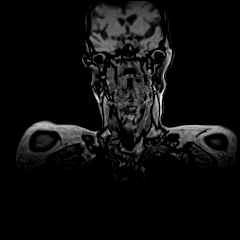
[im 32/96]
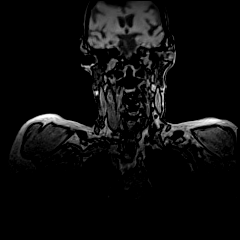
[im 37/96]
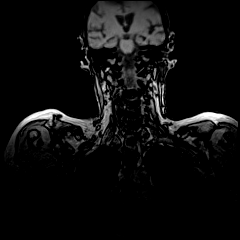
[im 43/96]
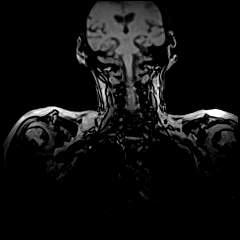
[im 48/96]
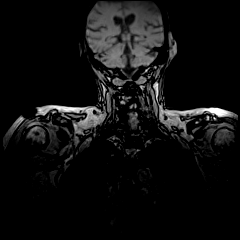
[im 53/96]
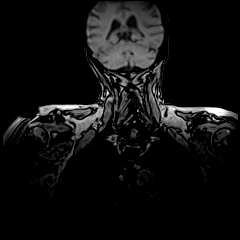
[im 59/96]
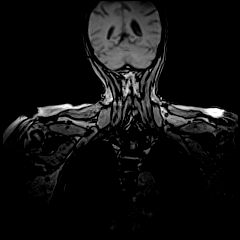
[im 64/96]
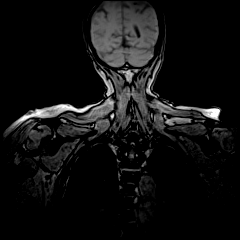
[im 69/96]
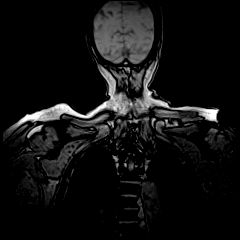
[im 74/96]
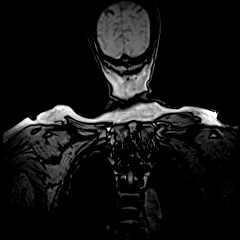
[im 80/96]
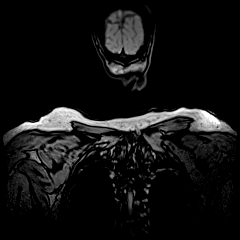
[im 85/96]
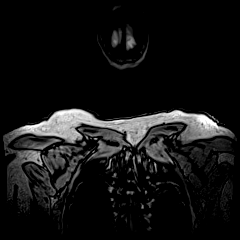
[im 90/96]
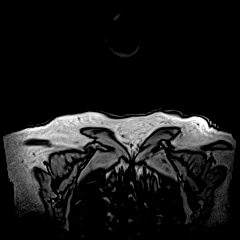
[im 96/96]
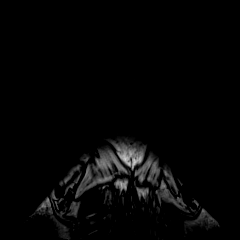

[Series 3: survey_mpr_sag · sagittal · 1.7mm · 1.67mm/px · 3 of 15 slices shown]
[im 1/15]
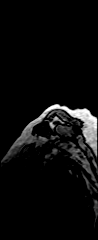
[im 8/15]
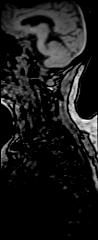
[im 15/15]
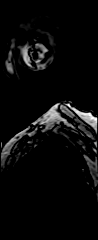

[Series 4: survey_mpr_(person_name) · axial · 1.7mm · 1.67mm/px · 1 of 7 slices shown]
[im 1/7]
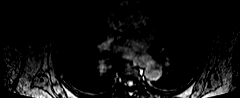

[Series 5: T2 · sagittal · 3.0mm · 0.57mm/px · 3 of 15 slices shown (1 of 2)]
[im 1/15]
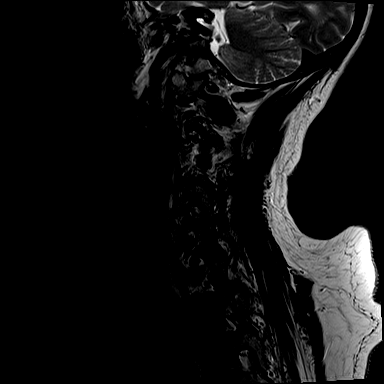
[im 8/15]
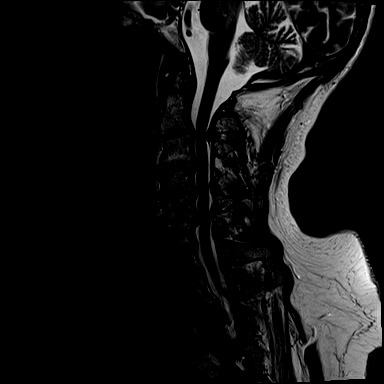
[im 15/15]
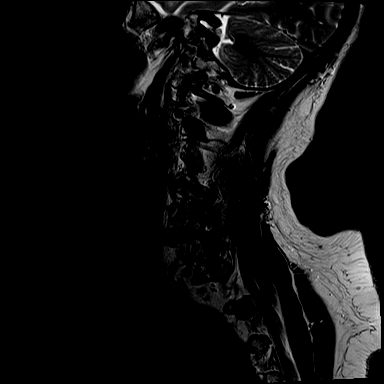

[Series 6: T1 · sagittal · 3.0mm · 0.69mm/px · 3 of 15 slices shown]
[im 1/15]
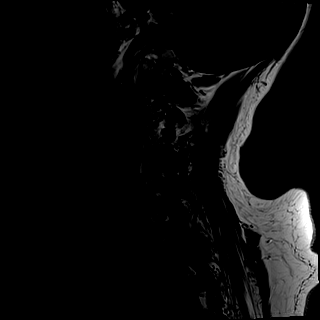
[im 8/15]
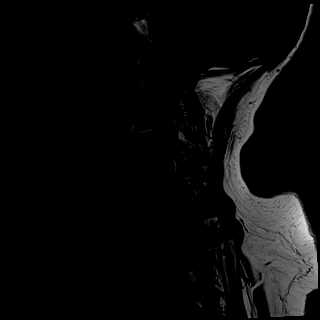
[im 15/15]
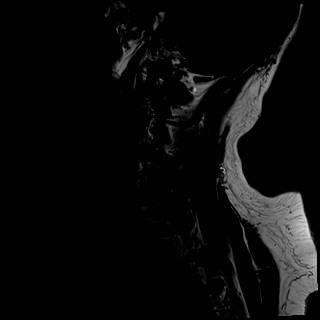

[Series 7: STIR · sagittal · 3.0mm · 0.86mm/px · 3 of 15 slices shown]
[im 1/15]
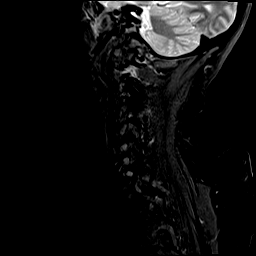
[im 8/15]
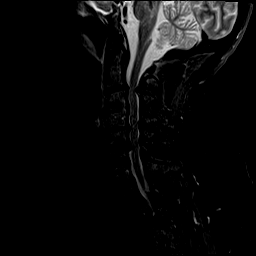
[im 15/15]
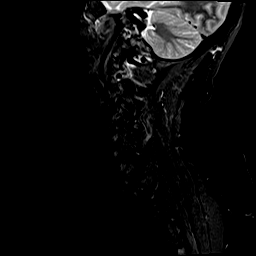

[Series 8: GRE · axial · 3.0mm · 0.70mm/px · z∈[-59,+34]mm · 6 of 30 slices shown]
[im 1/30]
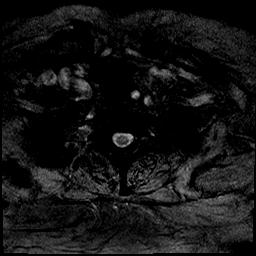
[im 6/30]
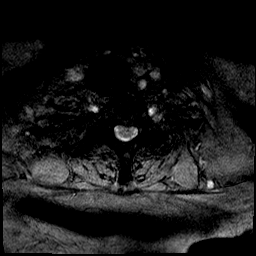
[im 12/30]
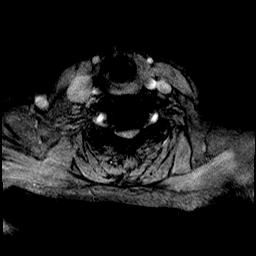
[im 18/30]
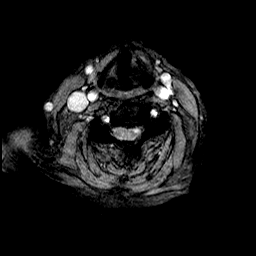
[im 24/30]
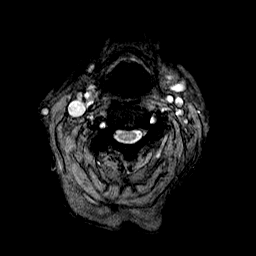
[im 30/30]
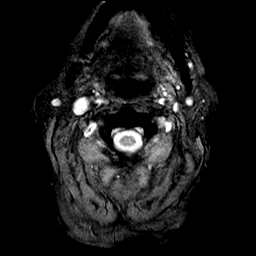

[Series 9: T2 · axial · 2.0mm · 0.70mm/px · z∈[-57,+41]mm · 10 of 52 slices shown (2 of 2)]
[im 1/52]
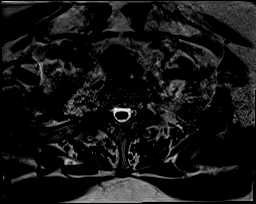
[im 6/52]
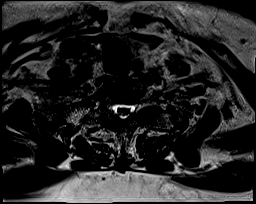
[im 12/52]
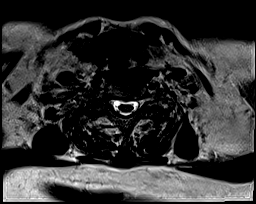
[im 18/52]
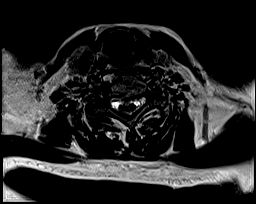
[im 23/52]
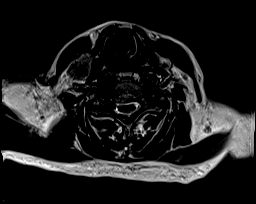
[im 29/52]
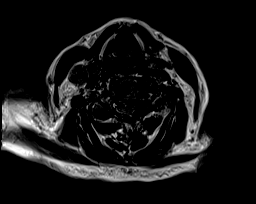
[im 35/52]
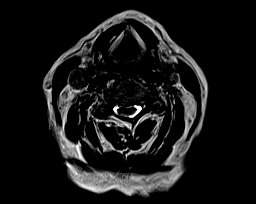
[im 40/52]
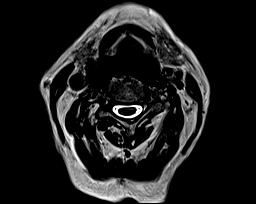
[im 46/52]
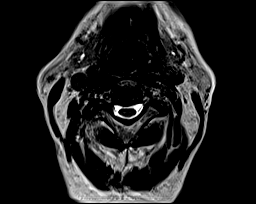
[im 52/52]
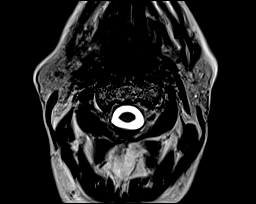

[48 of 48 positions shown; findings below may reference images not displayed]

IMPRESSION: There is mild developmental canal stenosis. Due to superimposed effects of the disc and osteophyte and due to ligament flavum hypertrophy there is further degree of canal narrowing which is moderate to severe C2-C3, moderate C4-C5, and severe C5-C6.
Neural foraminal narrowing is demonstrated severe left C2-C3, severe left C3-C4, severe left C4-C5, and severe bilateral C5-C6.
Dictation location #1.

## 2019-12-10 IMAGING — MR MRI LUMBAR SPINE W WO CONTRAST
4 of 9 series · 25 of 48 positions shown · non-contrast
Comparison: MRI lumbar spine 11/22/2019

EXAM: MRI LUMBAR SPINE WITH AND WITHOUT CONTRAST.
INDICATION: Back pain, cauda equina syndrome suspected
TECHNIQUE: MR imaging of the lumbar spine was performed with and without the use of IV contrast. Informed consent was obtained prior to the administration of contrast. Exam is limited by motion and magnetic stability artifact.

[Series 401: T2 · sagittal · 4.0mm · 0.50mm/px · 4 of 15 slices shown (1 of 2)]
[im 1/15]
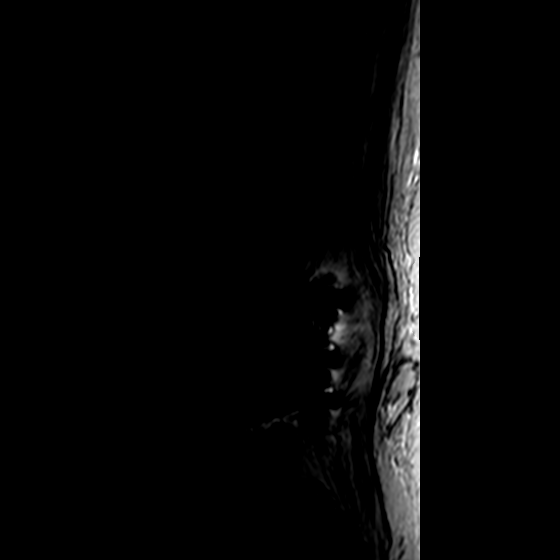
[im 5/15]
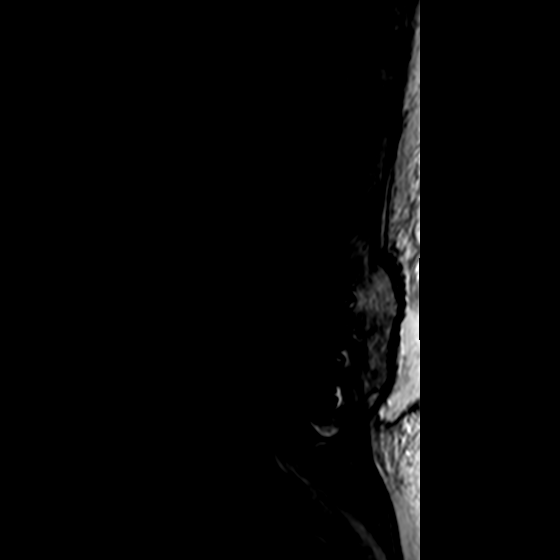
[im 10/15]
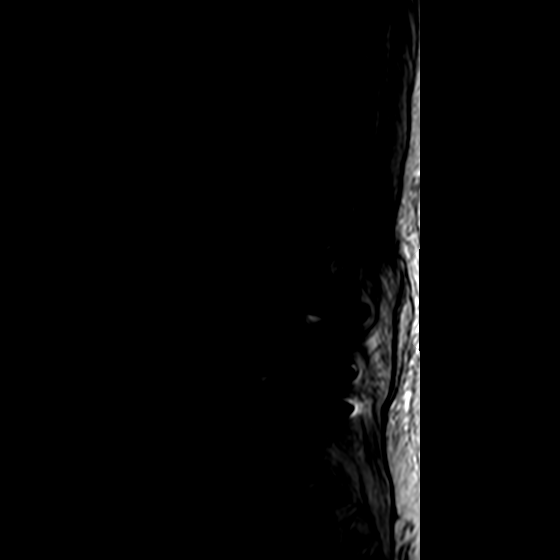
[im 15/15]
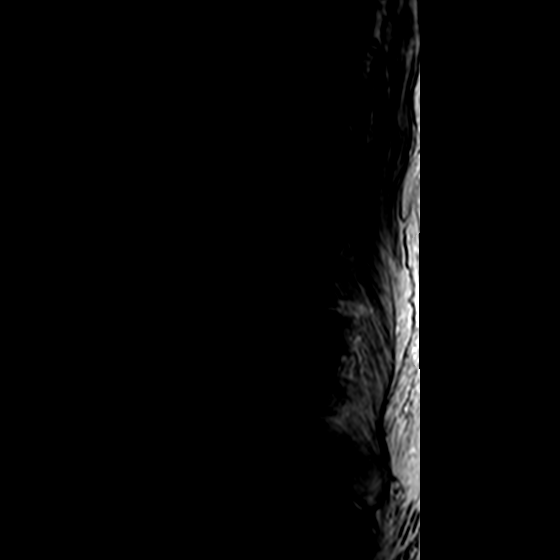

[Series 501: T1 · sagittal · 4.0mm · 0.50mm/px · 4 of 17 slices shown (1 of 2)]
[im 1/17]
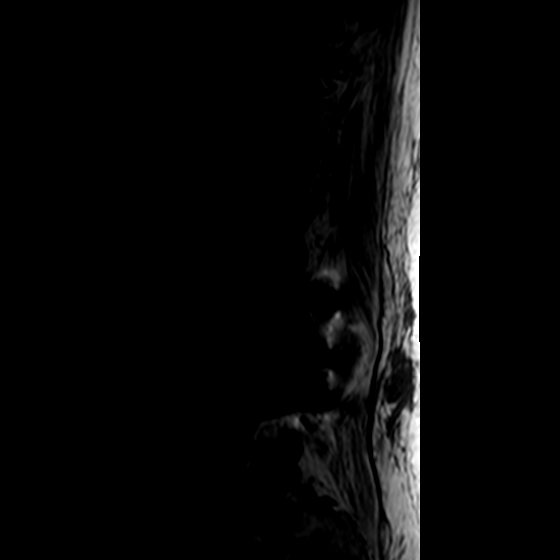
[im 6/17]
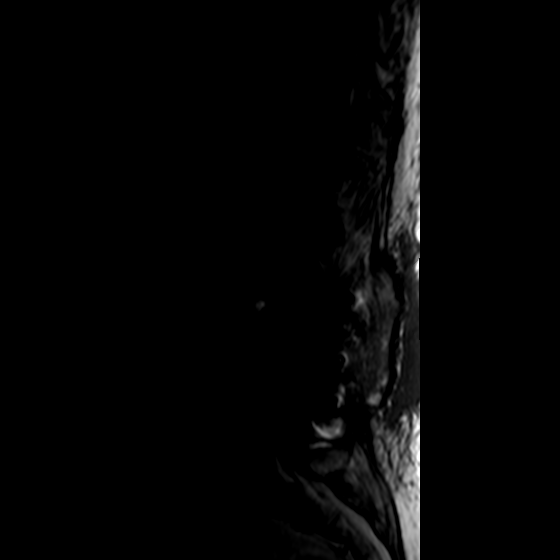
[im 11/17]
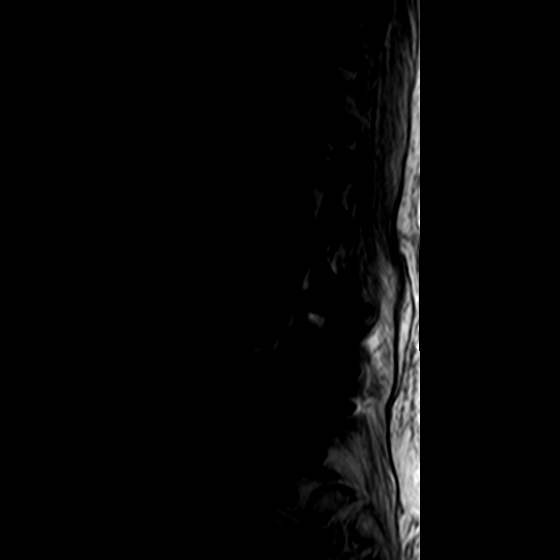
[im 17/17]
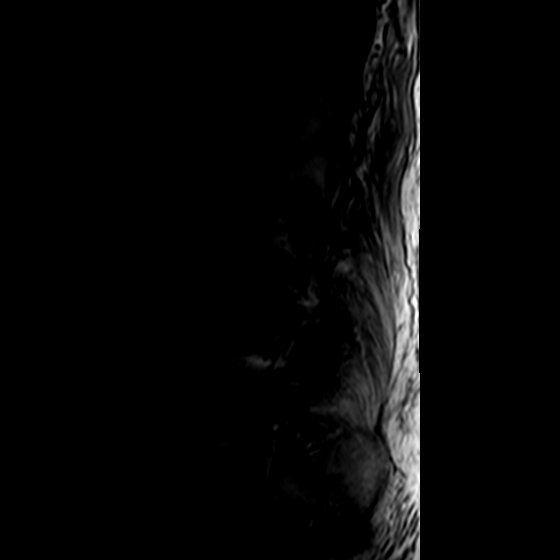

[Series 901: T1 · axial · 4.0mm · 0.49mm/px · z∈[-97,+35]mm · 8 of 36 slices shown (2 of 2)]
[im 1/36]
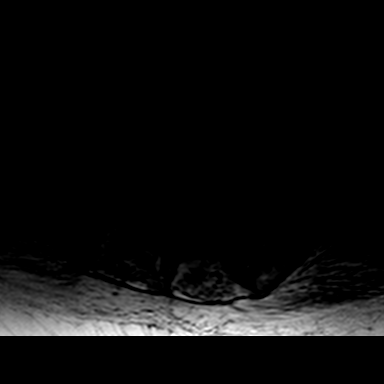
[im 5/36]
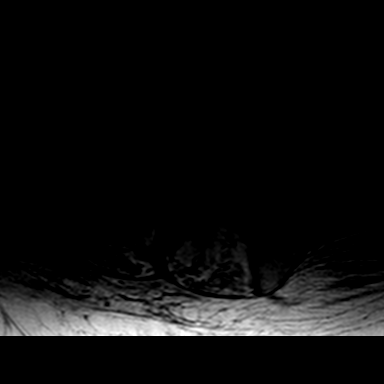
[im 9/36]
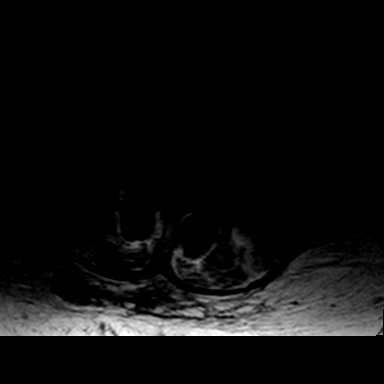
[im 14/36]
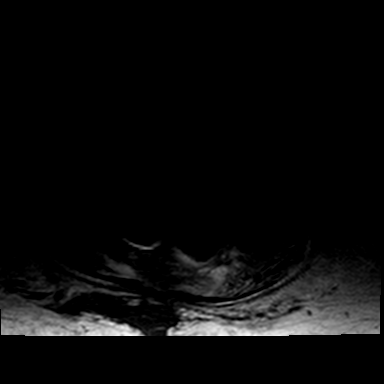
[im 18/36]
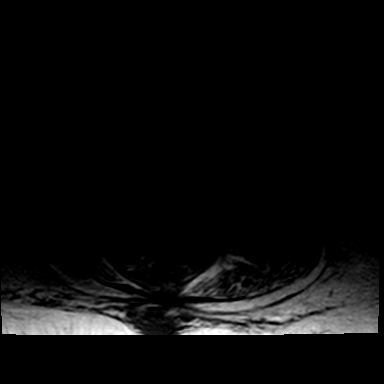
[im 22/36]
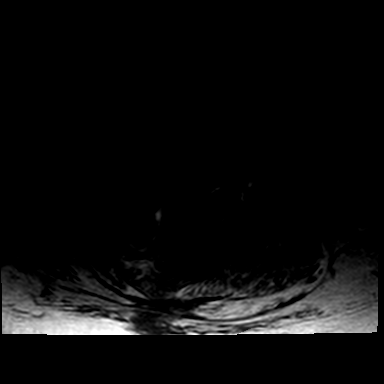
[im 27/36]
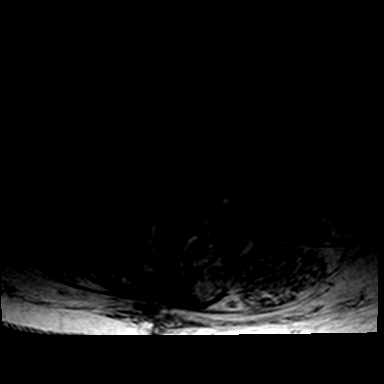
[im 31/36]
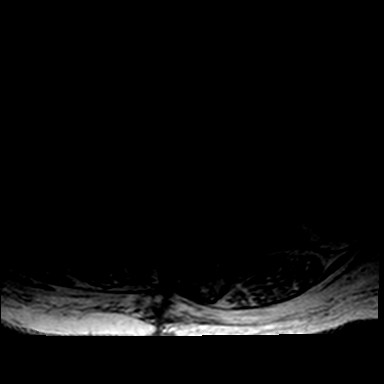

[Series 1001: T2 · axial · 4.0mm · 0.49mm/px · z∈[-97,+57]mm · 9 of 36 slices shown (2 of 2)]
[im 1/36]
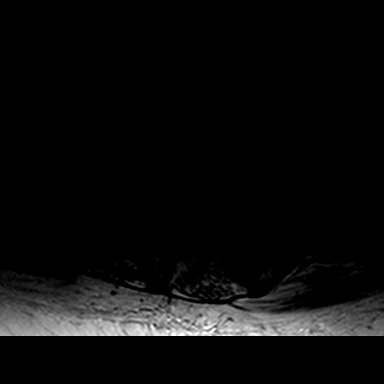
[im 5/36]
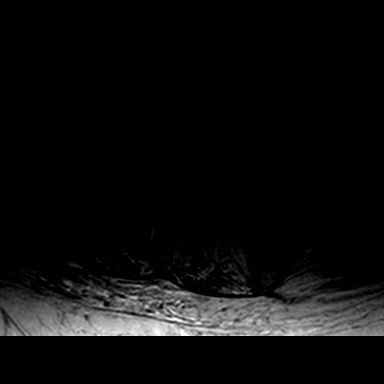
[im 9/36]
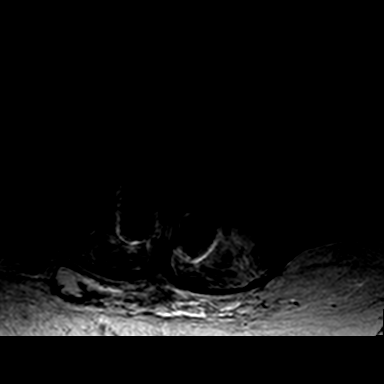
[im 14/36]
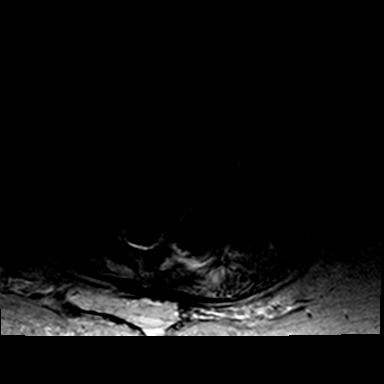
[im 18/36]
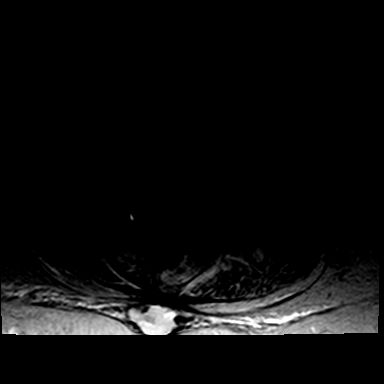
[im 22/36]
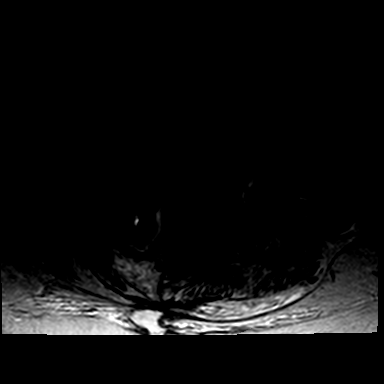
[im 27/36]
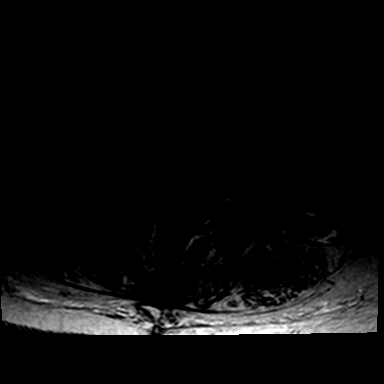
[im 31/36]
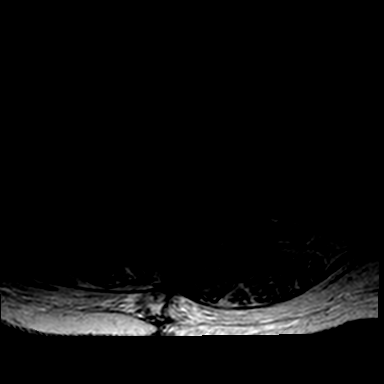
[im 36/36]
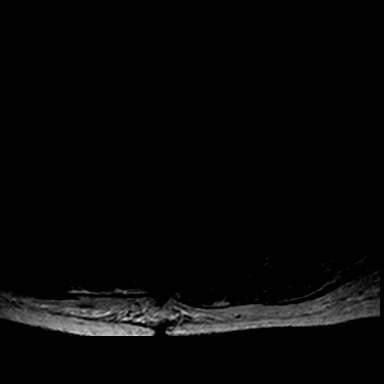

[25 of 48 positions shown; findings below may reference images not displayed]

FINDINGS: For the purposes of this report, the most caudal well-formed disc space is designated as L5-S1.
Moderate levoscoliosis centered at L2-L3. Mild to moderate hyperlordosis with 5 mm intervals listhesis of L1 on L2. Vertebral body heights are preserved and no abnormal enhancement is identified. Marrow signal is normal within the limitations of metallic susceptibility artifact. L3-L5 posterior instrumented fusion changes are present with pedicle screws, rods, L4-L5 interbody graft, and partial L4 laminectomy.
The conus is normal in signal and volume terminating at L1.
T10-L1: Only visualized on sagittal views with at least borderline mild spinal canal stenosis from T10-T12. Mild foraminal narrowing at T11-T12.
L1-L2: Moderate disc height loss with circumferential disc osteophyte formation. Mild to moderate spinal canal stenosis. Severe right and mild left foraminal narrowing. Moderate facet arthropathy with ligamentous thickening.
L2-L3: Moderate to severe disc height loss with circumferential osteophytosis. Moderate spinal canal stenosis. Severe right foraminal narrowing. Mild left foraminal narrowing.
L3-L4: Mild circumferential bulging of the disc. Moderate to severe facet arthropathy with ligamentous thickening. Moderate spinal canal stenosis with prominence of the epidural fat. Moderate left and severe right foraminal narrowing.
L4-L5: This level is not well-visualized secondary to artifact. There is mild to moderate disc height loss, in keeping with subsidence. There is enhancing annular granulation tissue and osteophyte formation. There is severe hypertrophic facet arthropathy with ligamentous thickening and severe spinal canal stenosis severe lateralizing stenosis bilaterally.
L5-S1: Moderate disc height loss with a left lateralized disc osteophyte complex. Moderate hypertrophic facet arthropathy. No spinal canal stenosis. Severe left and mild to moderate right lateralizing stenosis.
IMPRESSION: 1.  Advanced degenerative and postsurgical changes with severe L4-L5 and moderate to severe L3-L4 spinal canal stenosis.
2.  No significant change from prior MRI 11/22/2019, given differences in technique.
3.  Limited evaluation.
LOCATION CODE: 1

## 2019-12-10 IMAGING — CT CT LUMBAR SPINE WO CONTRAST
4 of 5 series · 17 of 33 positions shown, 19 images · non-contrast
Comparison: none

CT LUMBAR SPINE WITHOUT CONTRAST
INDICATION: Pain.
PROCEDURE:
2.5 mm axial CT images of the lumbar spine are obtained without IV contrast. Coronal and sagittal reconstructions were generated and reviewed.

[mpr, cor, coronal · coronal · 0.68mm/px · 3 of 91 slices shown]
[im 19/91  bone]
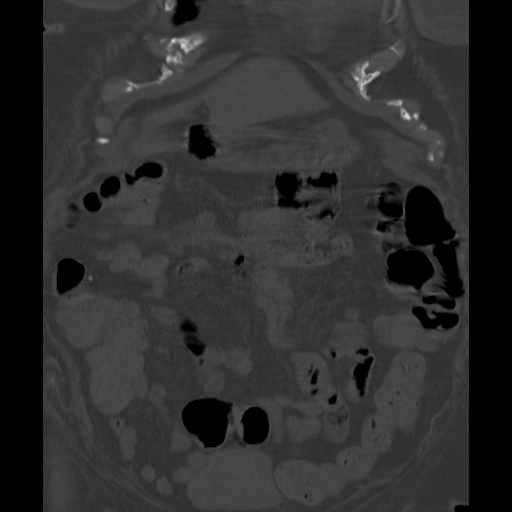
[im 37/91  bone]
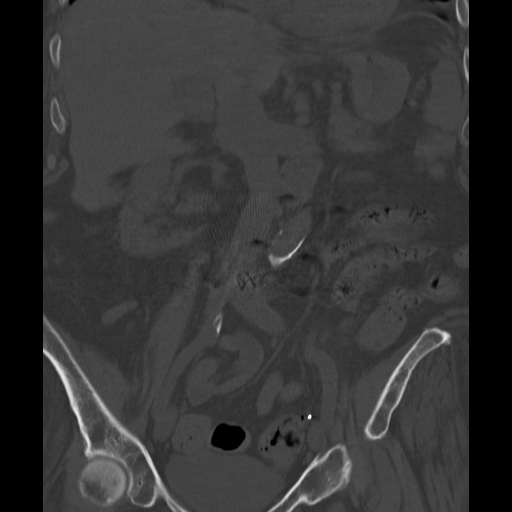
[im 55/91  bone]
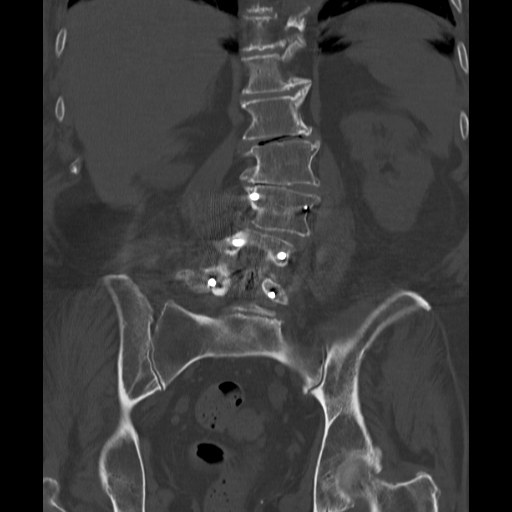

[mpr, sag, sagittal · sagittal · 0.68mm/px · 5 of 83 slices shown]
[im 14/83  bone]
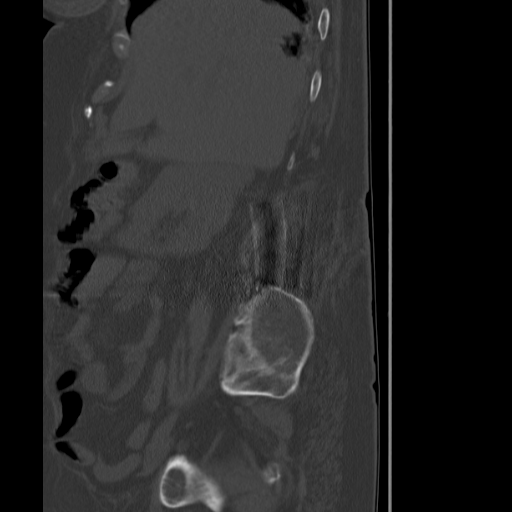
[im 28/83  bone]
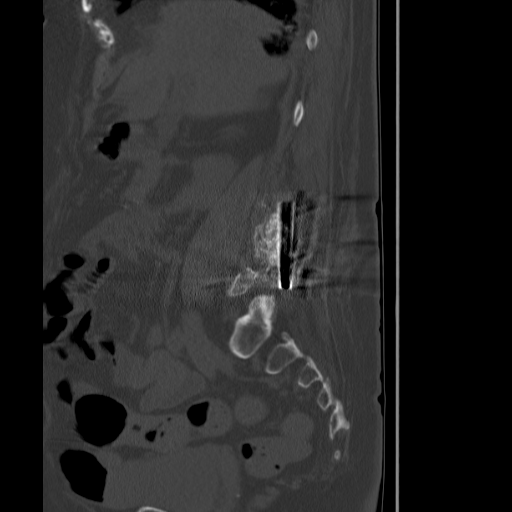
[im 42/83  bone]
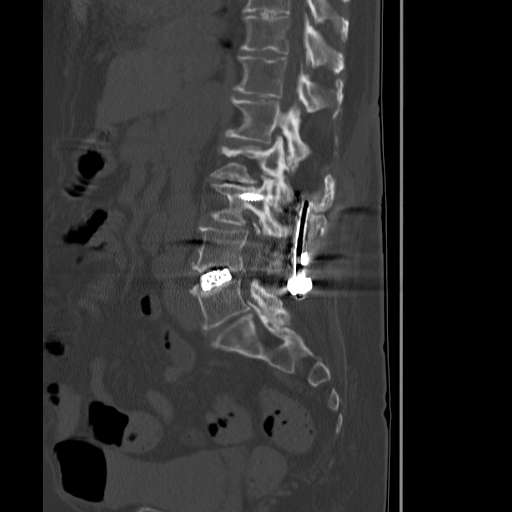
[im 55/83  bone]
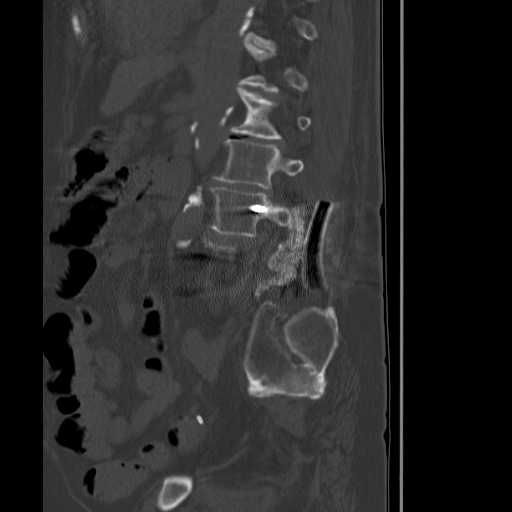
[im 69/83  bone]
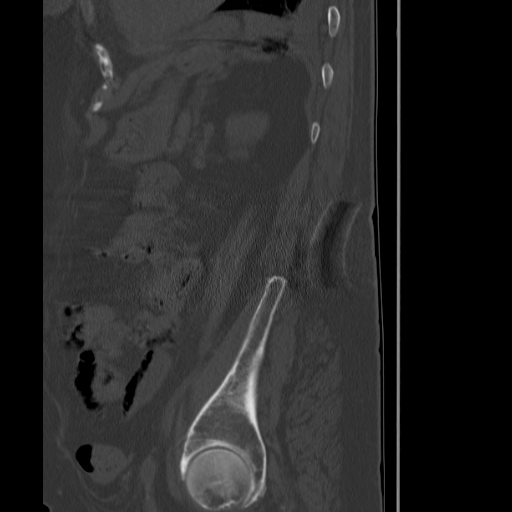

[mpr, axial, axial · axial · 0.57mm/px · z∈[-223,+39]mm · 7 of 175 slices shown, 9 images]
[im 22/175  soft-tissue]
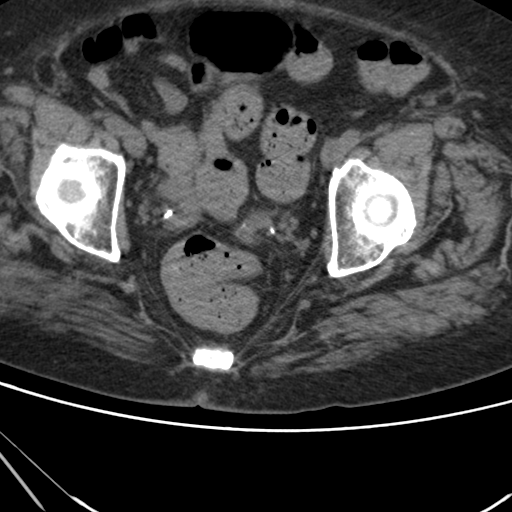
[im 22/175  bone]
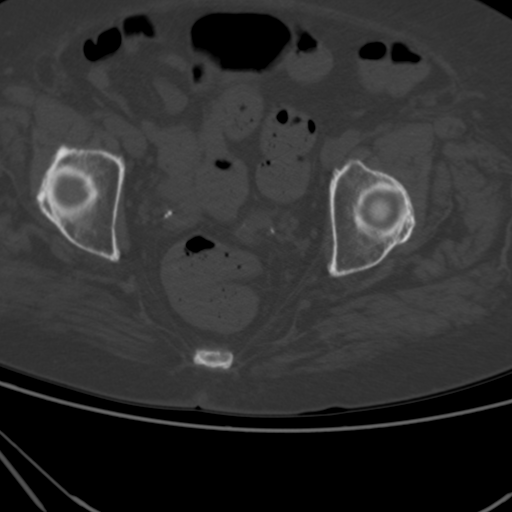
[im 44/175  bone]
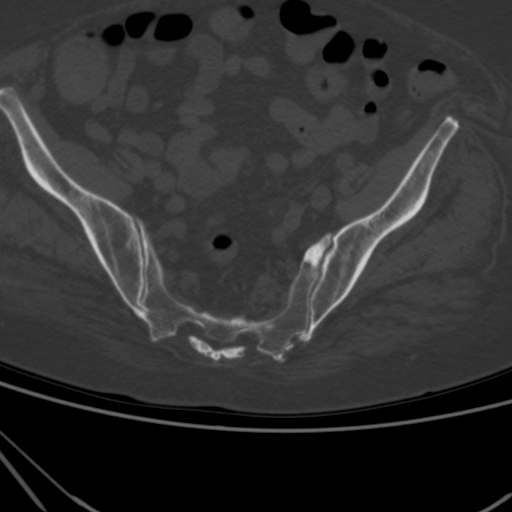
[im 66/175  bone]
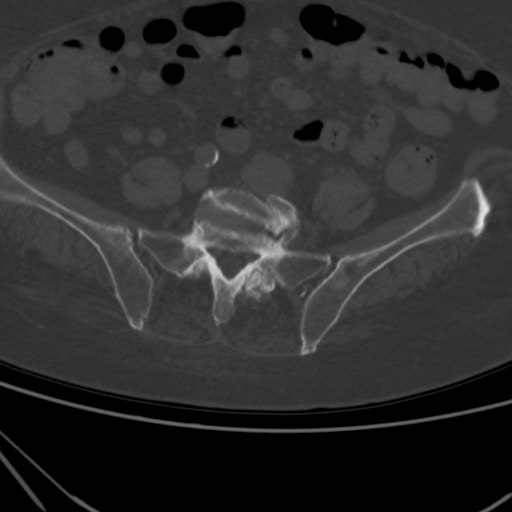
[im 88/175  bone]
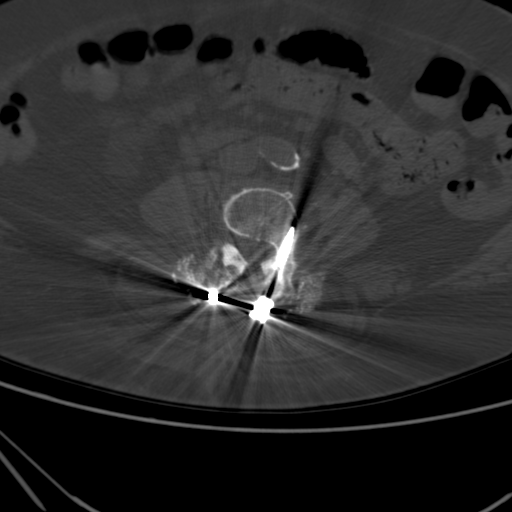
[im 109/175  soft-tissue]
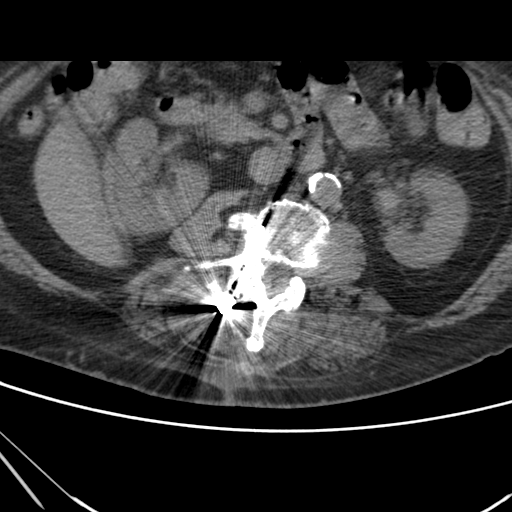
[im 109/175  bone]
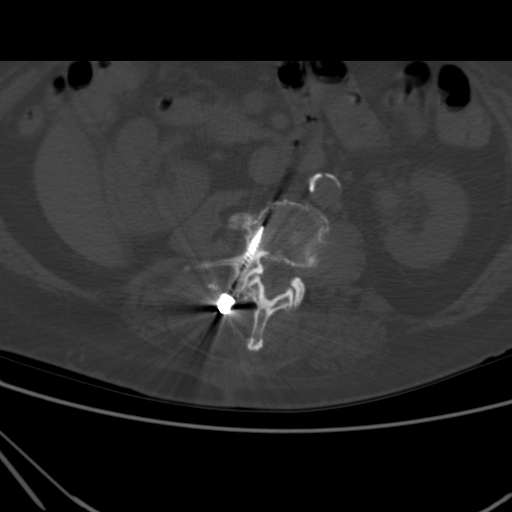
[im 131/175  bone]
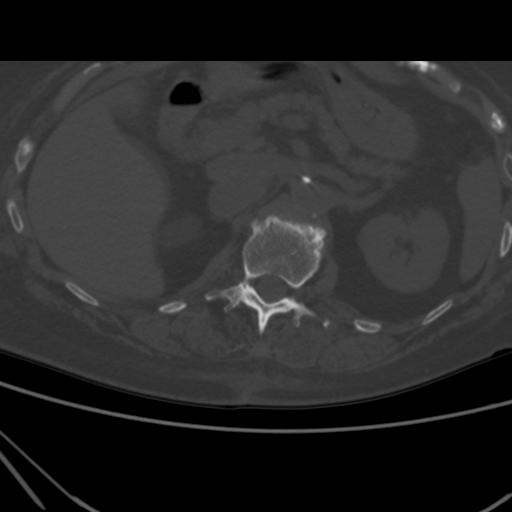
[im 153/175  bone]
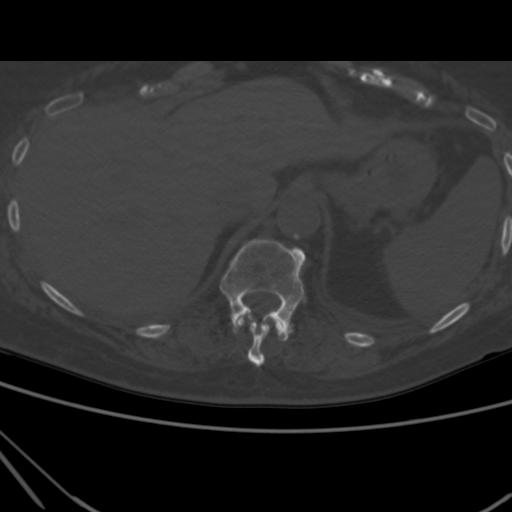

[mpr, l5-s1 · axial · 0.54mm/px · z∈[-221,-182]mm · 2 of 85 slices shown]
[im 22/85  bone]
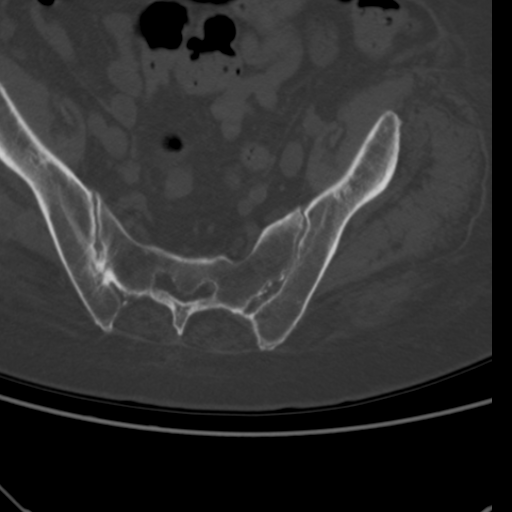
[im 43/85  bone]
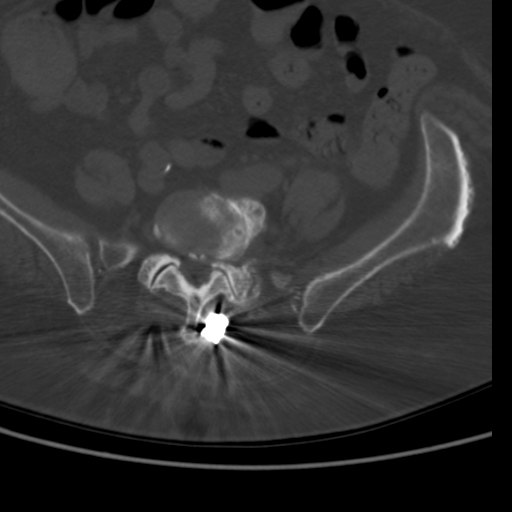

[17 of 33 positions shown; findings below may reference images not displayed]

FINDINGS: The bones are osteopenic. There is moderate levoscoliosis of the thoracolumbar spine with 29 degrees leftward convex curvature. The apex is centered at L2. No suggestion for subluxation on lateral views. Posterior fusion hardware from L3-L5. L4-L5 interbody spacer. L4 decompressive laminectomy also present. Multilevel degenerative changes with significant diminished disc space height at L1-L2, L2-L3 and L5-S1. Moderate bilateral neural foraminal narrowing on the right at L1-L2 and L2-L3. Fusion hardware creates severe streak artifact. There appears to be at least moderate spinal stenosis at L3-L4. No evidence for acute fracture identified. The paraspinal soft tissues are grossly unremarkable. Trace bilateral pleural effusions. Upper abdominal structures are degraded by motion artifact.
IMPRESSION: Advanced osteopenia. No evidence for acute fracture or subluxation.
Moderate levoscoliosis centered at L2.
Advanced multilevel degenerative disc disease with significant right-sided neural foraminal narrowing at L1-L2 and L2-L3.
Moderate spinal stenosis at L3-L4. Evaluation limited by streak artifact from orthopedic hardware.
Posterior fusion hardware from L3-L5 intact with L4 decompressive laminectomy.
Location code: 1

## 2020-03-21 IMAGING — DX XR CHEST 1 VIEW
1 series · 1 of 1 positions shown · non-contrast
Comparison: none

Exam: Single view of the chest.
REASON FOR EXAM: Weakness.

[AP]
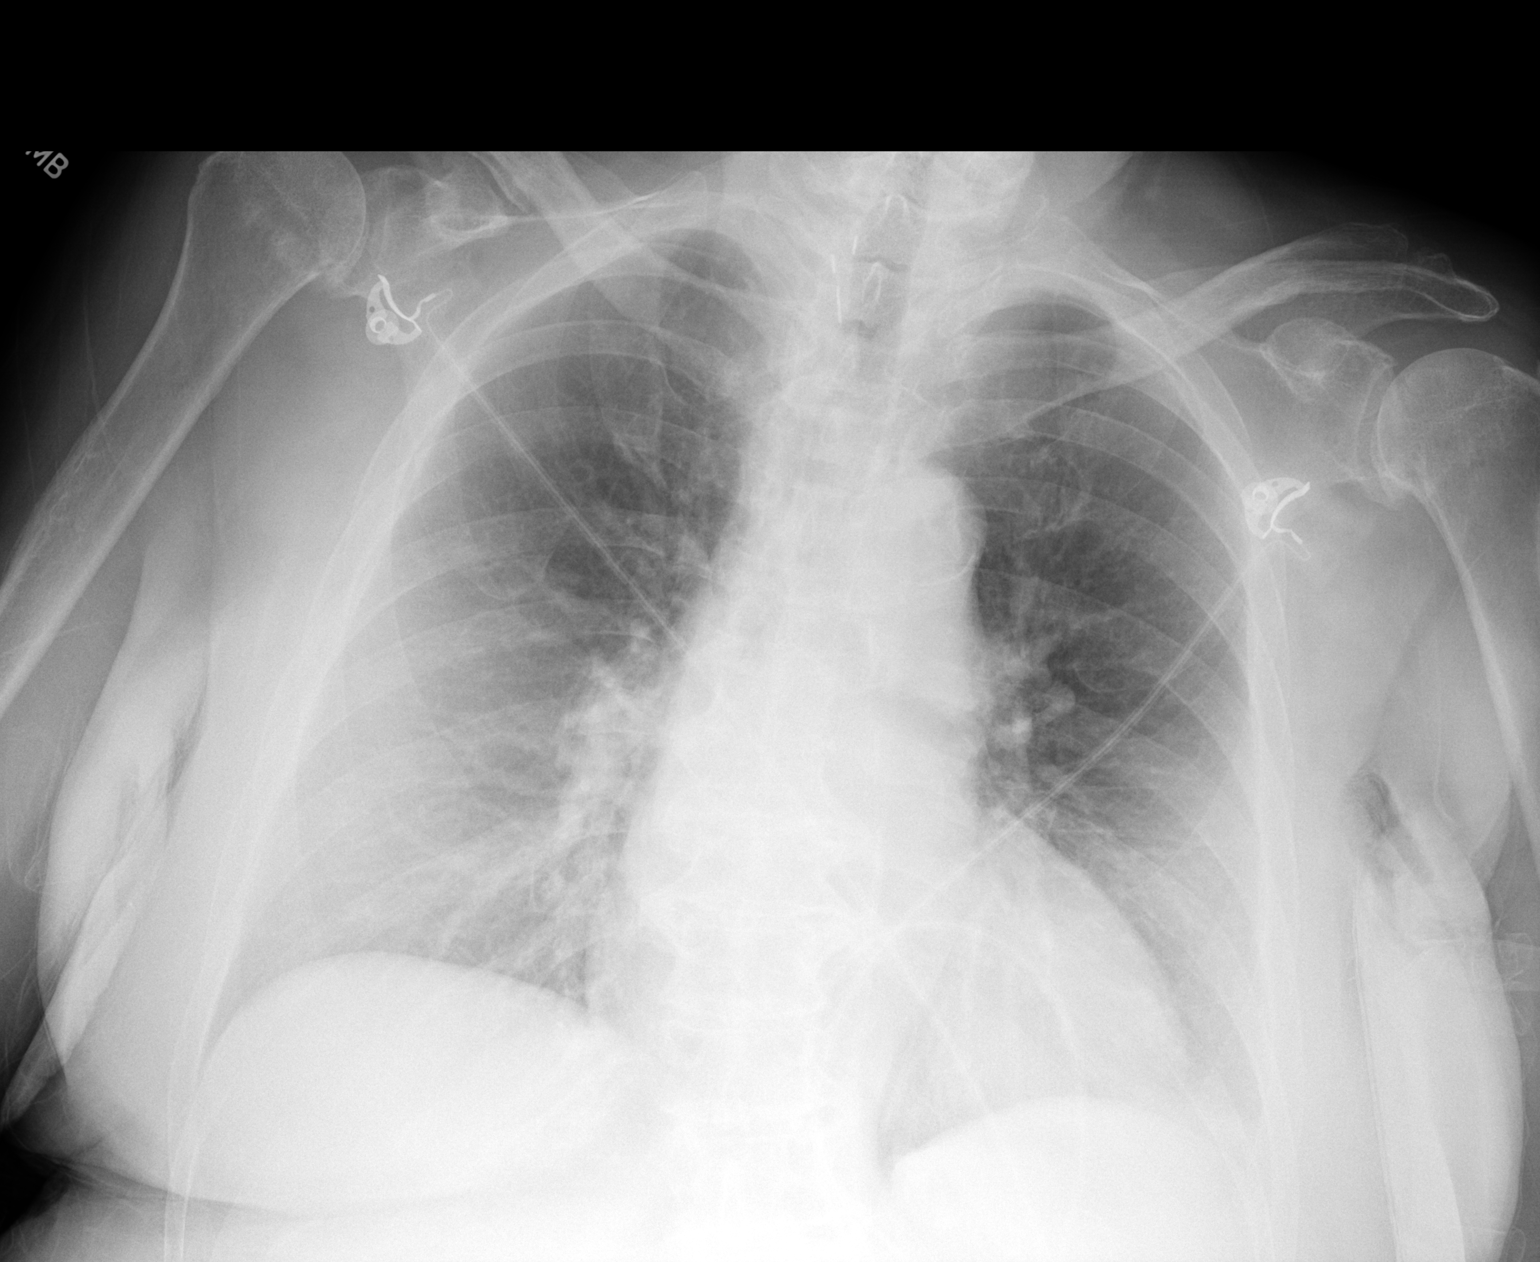

[1 of 1 positions shown; findings below may reference images not displayed]

FINDINGS: The cardiac silhouette is of normal size. The lungs are clear. There is no pneumothorax or pleural effusion. No acute osseous abnormality is seen.
IMPRESSION: : No radiographic evidence of acute cardiopulmonary disease.

## 2020-03-21 IMAGING — CT CT HEAD WO CONTRAST
1 series · 16 of 30 positions shown, 20 images · non-contrast
Comparison: none

Exam: Head CT.
REASON FOR EXAM: Headache.
TECHNIQUE: Contiguous 5-mm axial images were obtained from the base of the skull up to the level of the vertex without the administration of IV contrast.  A prior is available for comparison.

[Series 2: head stnd · axial · 0.49mm/px · z∈[-19,+143]mm · 16 of 36 slices shown, 20 images]
[im 2/36  brain]
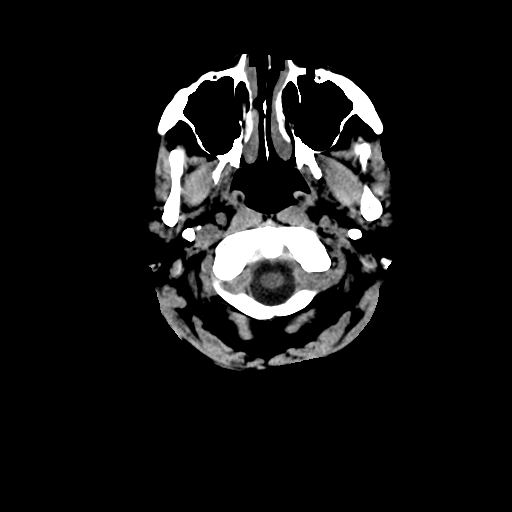
[im 2/36  bone]
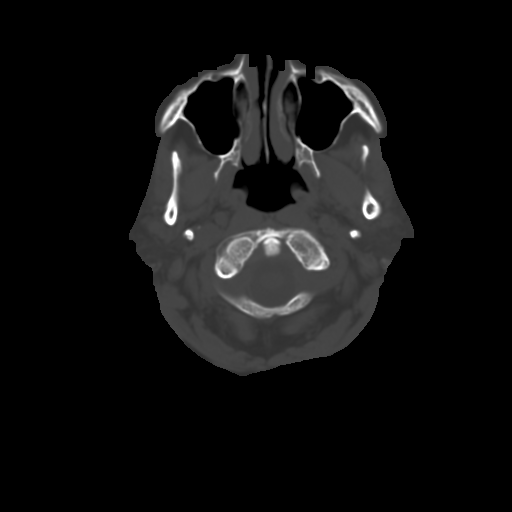
[im 4/36  brain]
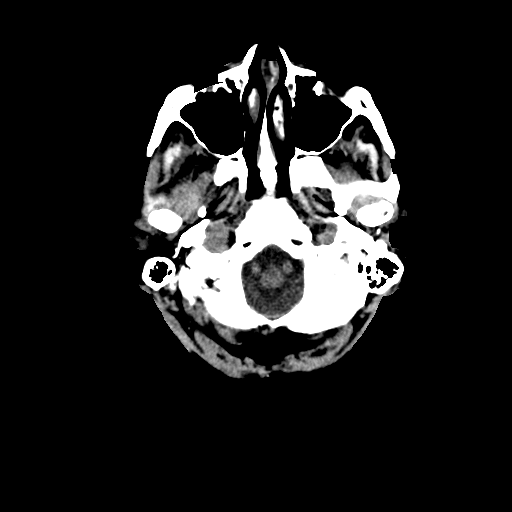
[im 7/36  brain]
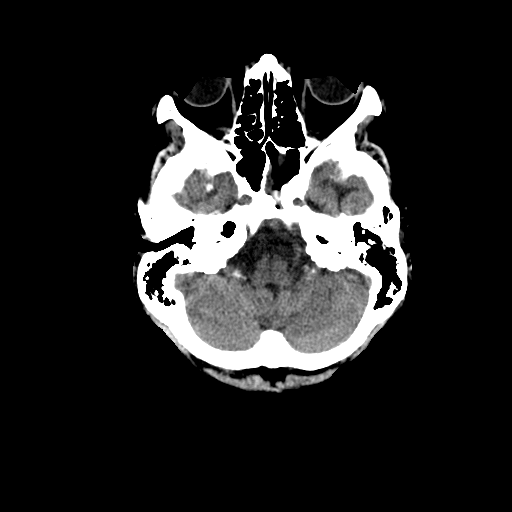
[im 9/36  brain]
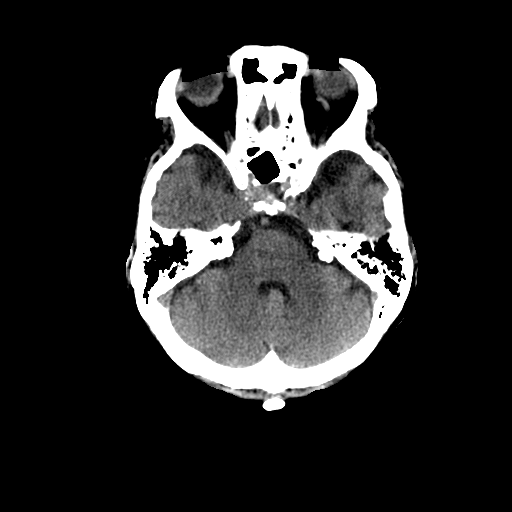
[im 10/36  brain]
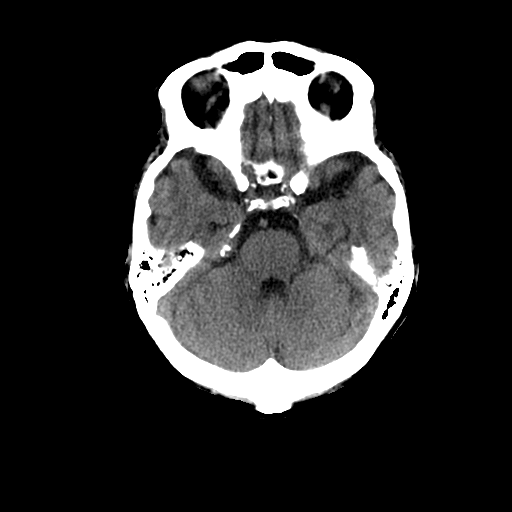
[im 10/36  bone]
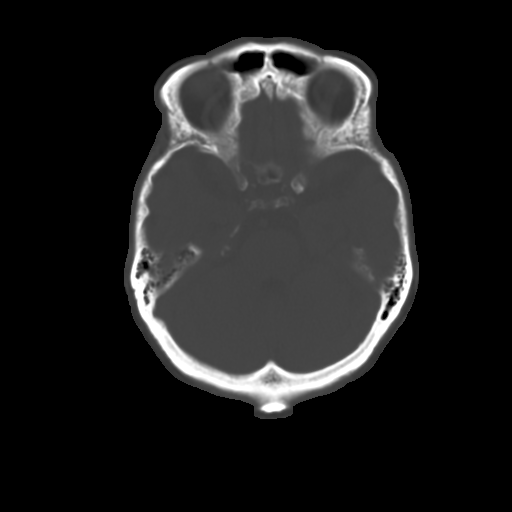
[im 13/36  brain]
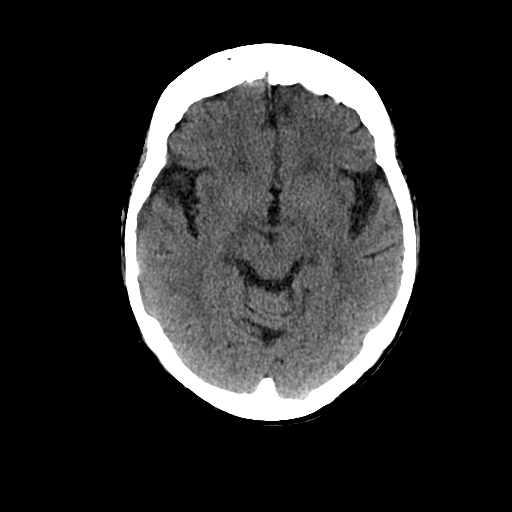
[im 15/36  brain]
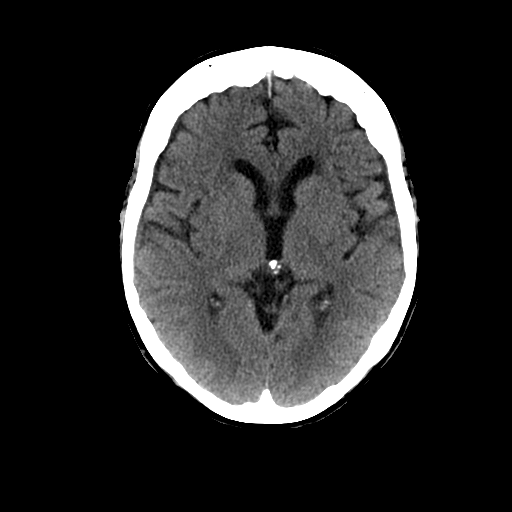
[im 17/36  brain]
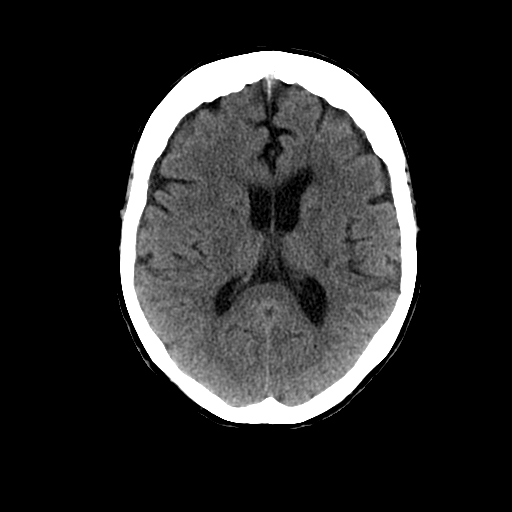
[im 19/36  brain]
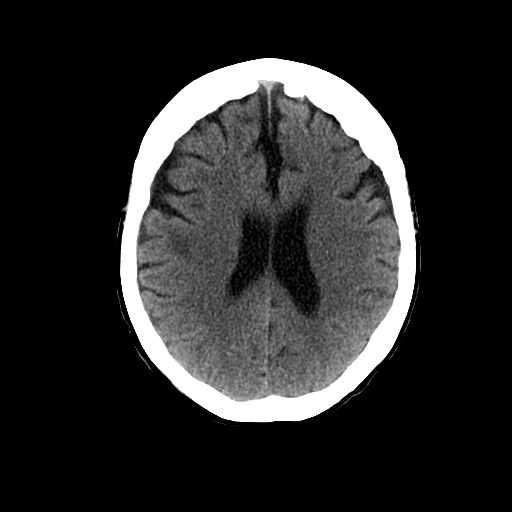
[im 19/36  bone]
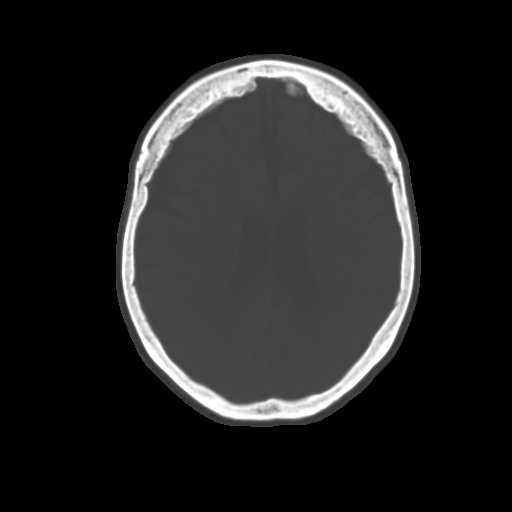
[im 21/36  brain]
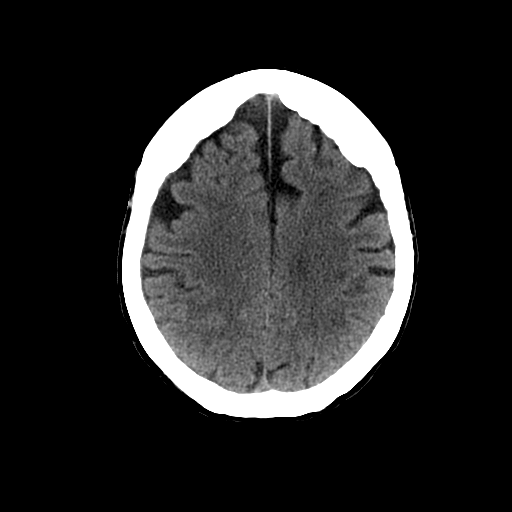
[im 23/36  brain]
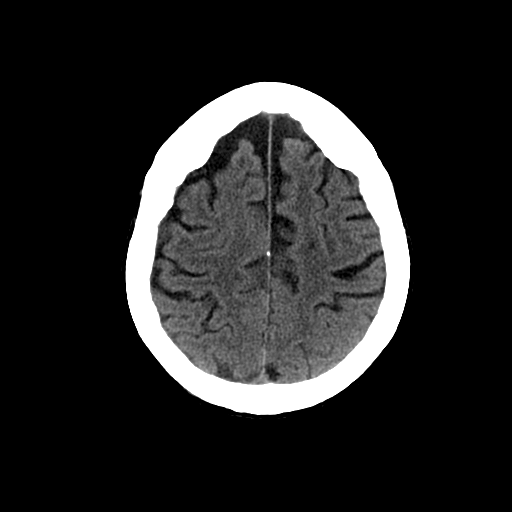
[im 26/36  brain]
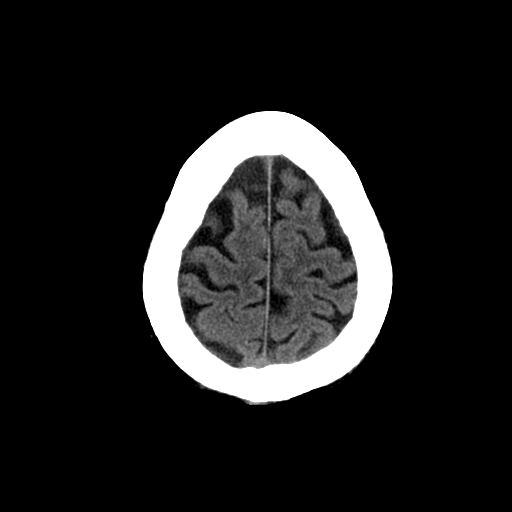
[im 27/36  brain]
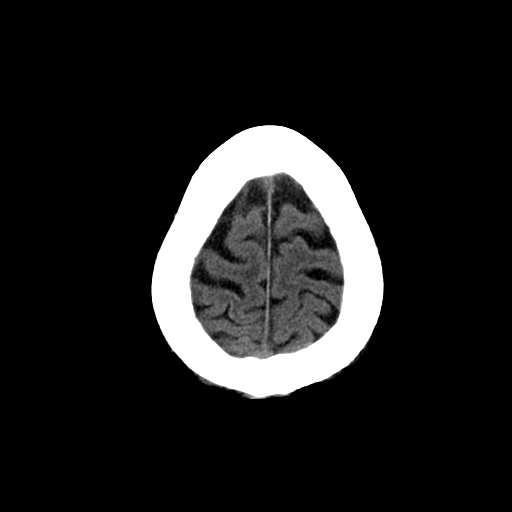
[im 27/36  bone]
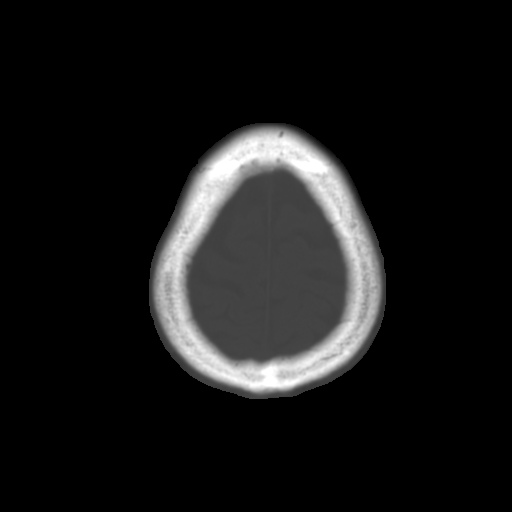
[im 29/36  brain]
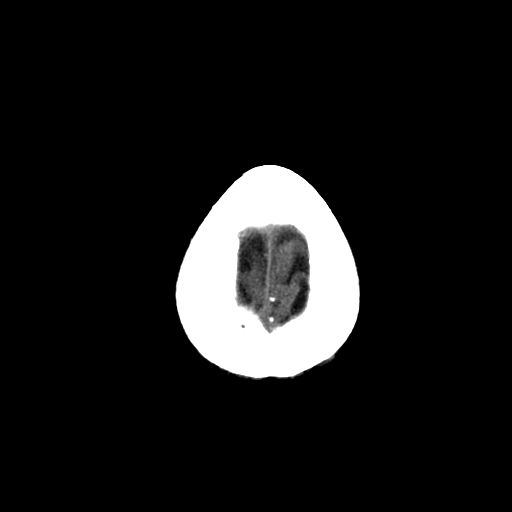
[im 32/36  brain]
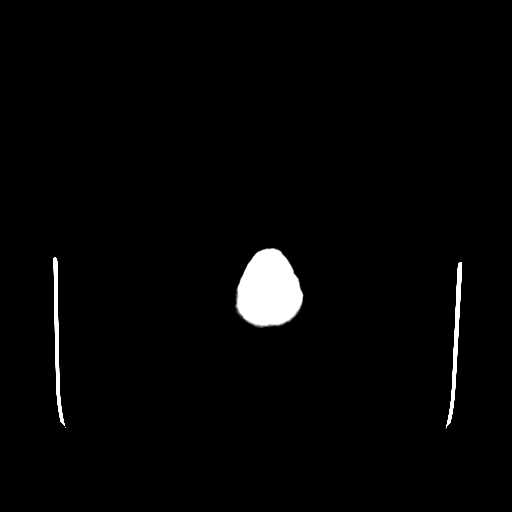
[im 34/36  brain]
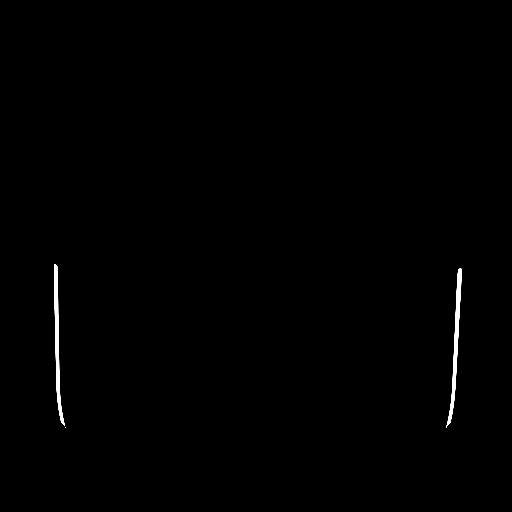

[16 of 30 positions shown; findings below may reference images not displayed]

FINDINGS: There is no intra-or extra-axial mass or fluid collection.  There is no intracranial hemorrhage, mass-effect, or midline shift.  The ventricles and sulci are prominent likely representing age appropriate atrophy.  Scattered periventricular and subcortical white matter hypodensities are seen suggestive of chronic small vessel ischemia. There is no evidence of an acute major arterial distribution infarct.
The paranasal sinuses and mastoid air cells are well pneumatized.  No fracture is identified.
IMPRESSION: :
1.  No evidence of acute intracranial pathology.
2. Volume loss, likely age appropriate.
3.  Periventricular and subcortical white matter hypodensity suggestive of chronic small vessel ischemia.
Location:1

## 2020-03-22 IMAGING — CT CT HEAD WO CONTRAST
1 series · 16 of 30 positions shown, 20 images · non-contrast
Comparison: none

Exam: Head CT.
REASON FOR EXAM: Headache.
TECHNIQUE: Contiguous 5-mm axial images were obtained from the base of the skull up to the level of the vertex without the administration of IV contrast.  No prior is available for comparison.

[Series 2: head stnd · axial · 0.49mm/px · z∈[-33,+130]mm · 16 of 36 slices shown, 20 images]
[im 2/36  brain]
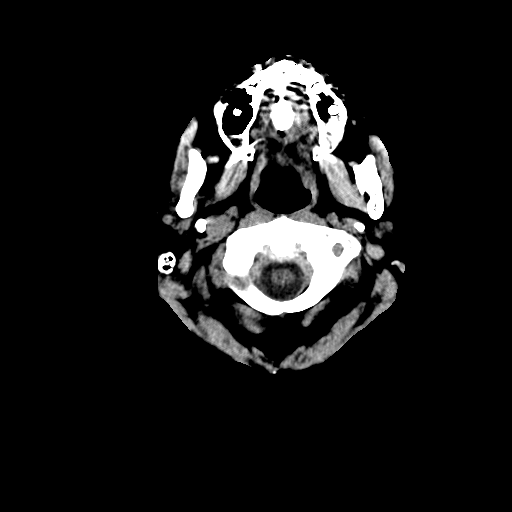
[im 2/36  bone]
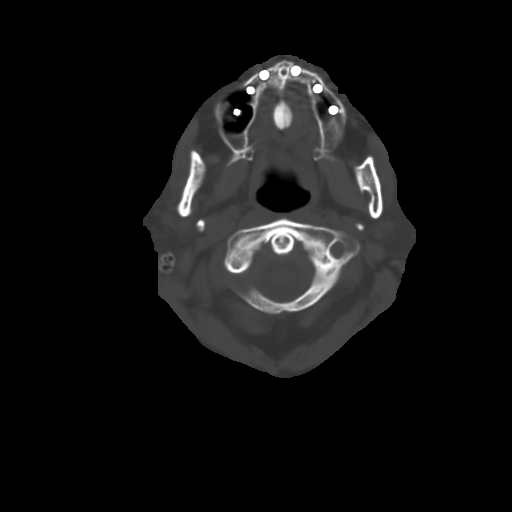
[im 4/36  brain]
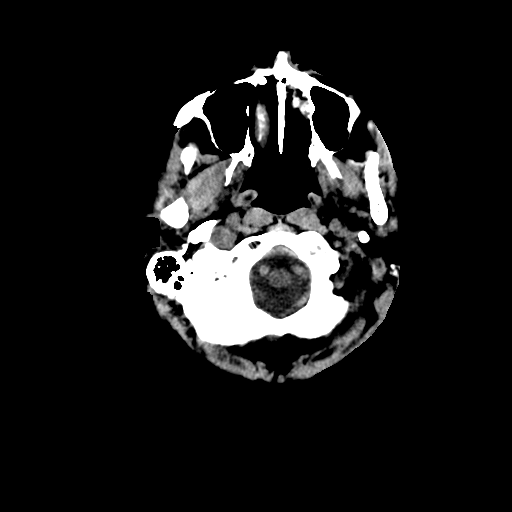
[im 7/36  brain]
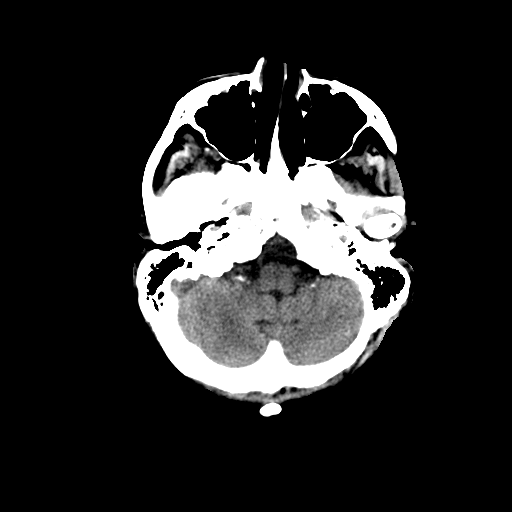
[im 9/36  brain]
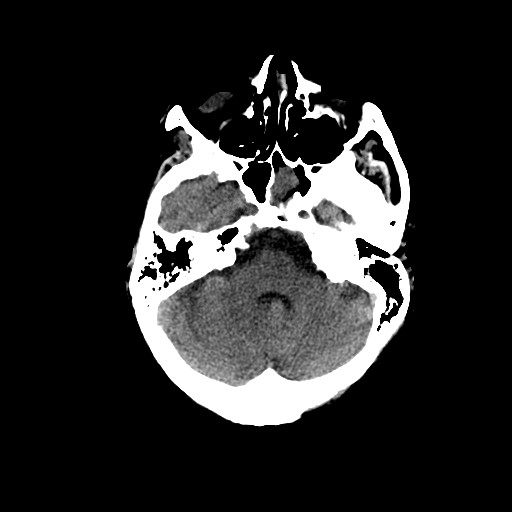
[im 10/36  brain]
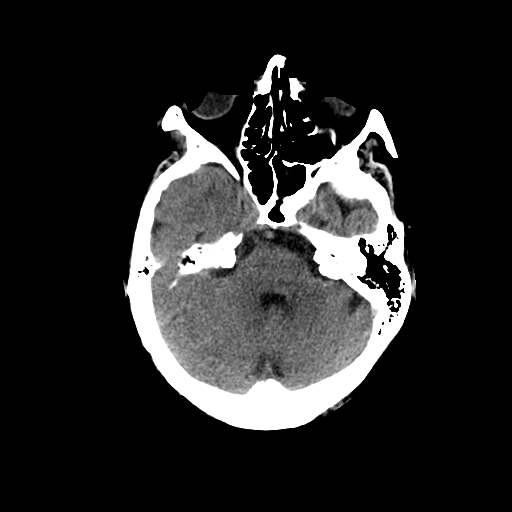
[im 10/36  bone]
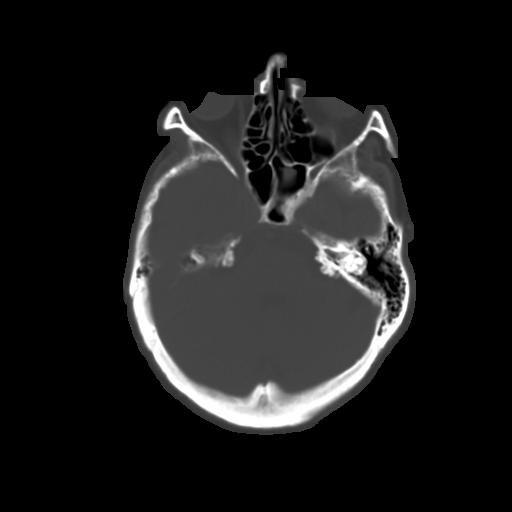
[im 13/36  brain]
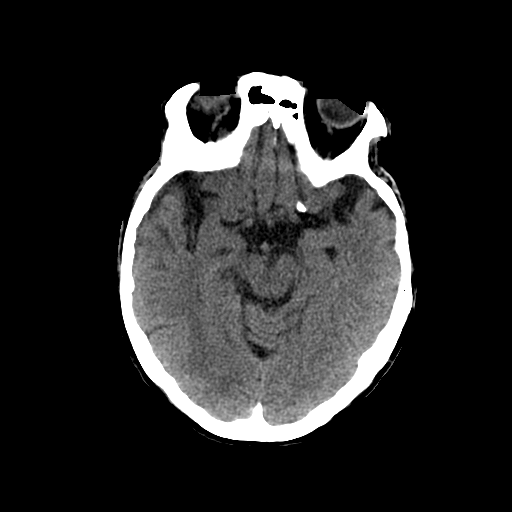
[im 15/36  brain]
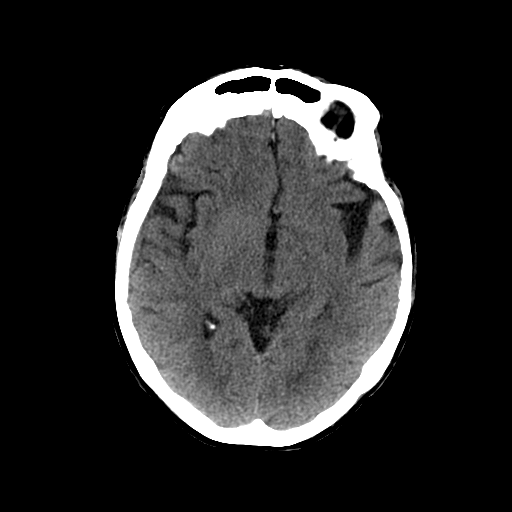
[im 17/36  brain]
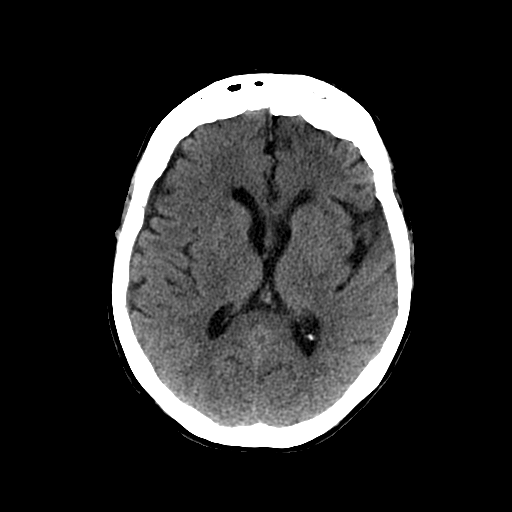
[im 19/36  brain]
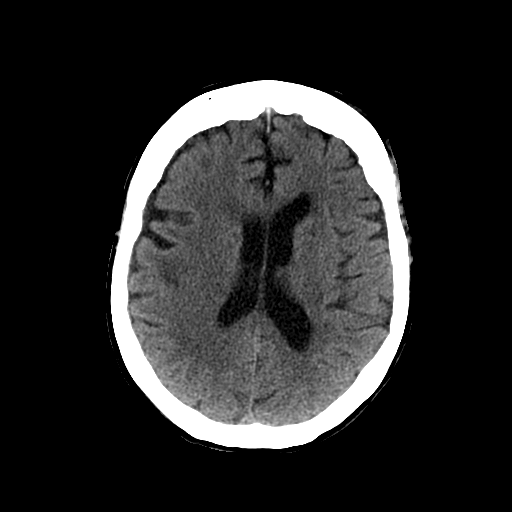
[im 19/36  bone]
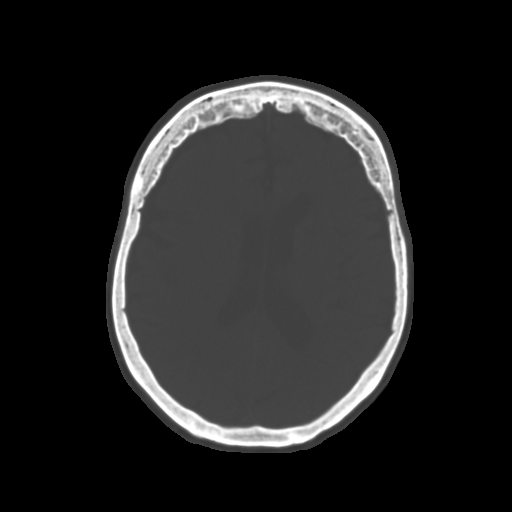
[im 21/36  brain]
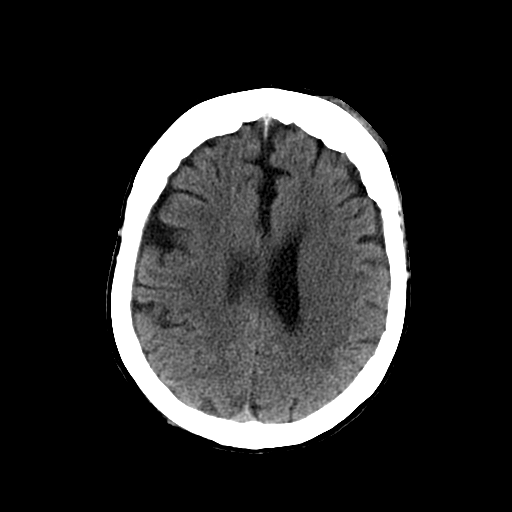
[im 23/36  brain]
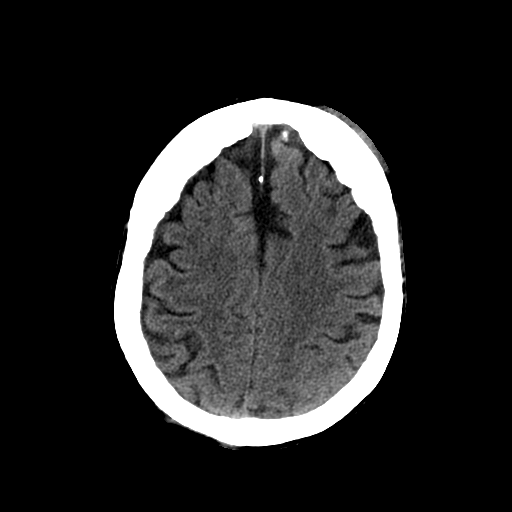
[im 26/36  brain]
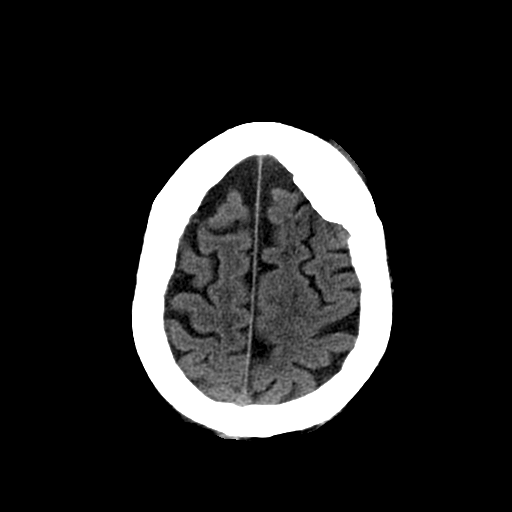
[im 27/36  brain]
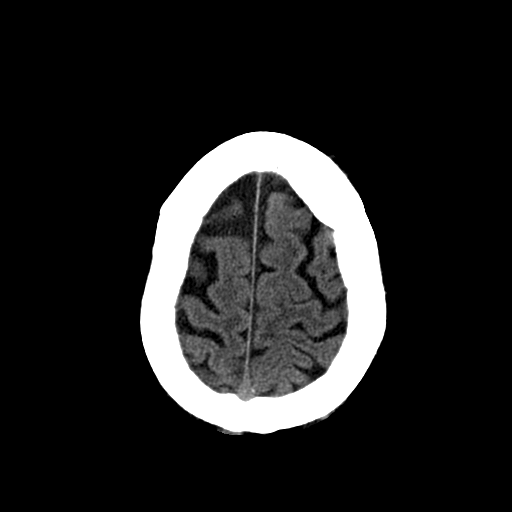
[im 27/36  bone]
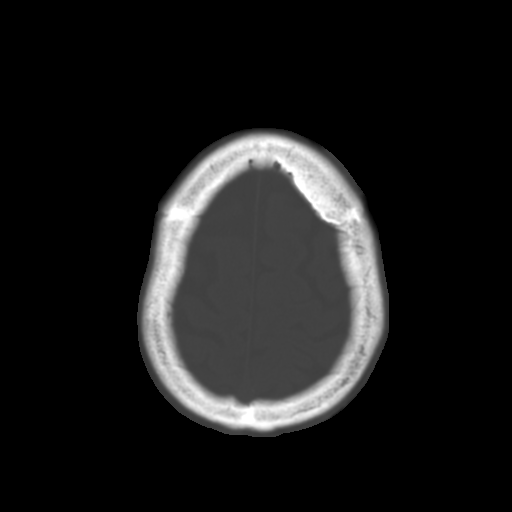
[im 29/36  brain]
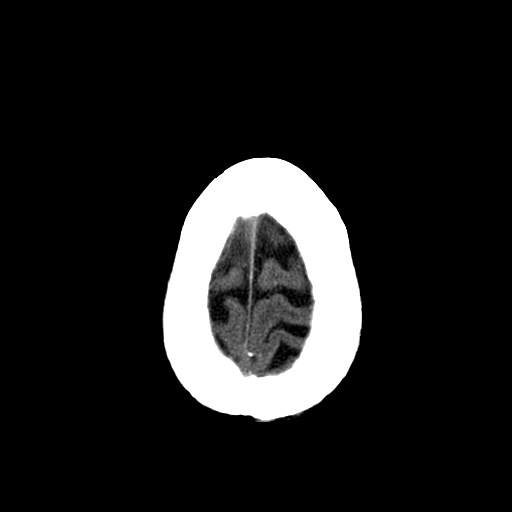
[im 32/36  brain]
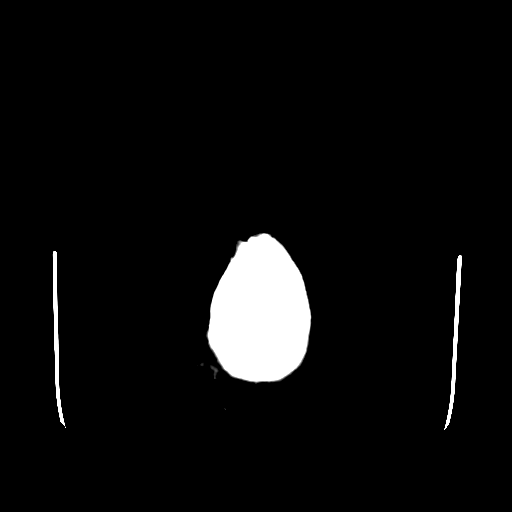
[im 34/36  brain]
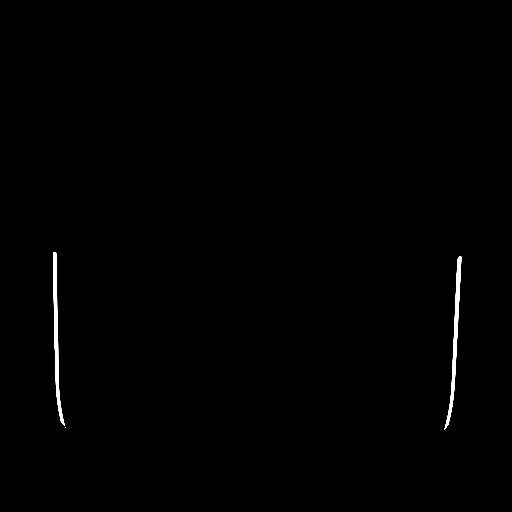

[16 of 30 positions shown; findings below may reference images not displayed]

FINDINGS: There is no intra-or extra-axial mass or fluid collection.  There is no intracranial hemorrhage, mass-effect, or midline shift.  The ventricles and sulci are prominent likely representing age appropriate atrophy.  Scattered periventricular and subcortical white matter hypodensities are seen suggestive of chronic small vessel ischemia. There is no evidence of an acute major arterial distribution infarct.
The paranasal sinuses and mastoid air cells are well pneumatized.  No fracture is identified. A left frontal scalp hematoma is noted.
IMPRESSION: :
1.  No evidence of acute intracranial pathology.
2. Volume loss, likely age appropriate.
3.  Periventricular and subcortical white matter hypodensity suggestive of chronic small vessel ischemia.
Location:1

## 2021-11-17 ENCOUNTER — Other Ambulatory Visit (HOSPITAL_COMMUNITY): Payer: Self-pay | Admitting: NURSE PRACTITIONER

## 2021-11-17 DIAGNOSIS — R059 Cough, unspecified: Secondary | ICD-10-CM

## 2021-11-28 ENCOUNTER — Inpatient Hospital Stay
Admission: RE | Admit: 2021-11-28 | Discharge: 2021-11-28 | Disposition: A | Discharge status: Home / Self Care | Payer: Medicare Other | Source: Ambulatory Visit | Attending: NURSE PRACTITIONER | Admitting: NURSE PRACTITIONER

## 2021-11-28 ENCOUNTER — Other Ambulatory Visit (HOSPITAL_BASED_OUTPATIENT_CLINIC_OR_DEPARTMENT_OTHER): Payer: Medicare Other

## 2021-11-28 ENCOUNTER — Other Ambulatory Visit: Payer: Self-pay

## 2021-11-28 DIAGNOSIS — R0602 Shortness of breath: Secondary | ICD-10-CM

## 2021-11-28 DIAGNOSIS — R059 Cough, unspecified: Secondary | ICD-10-CM

## 2021-11-28 DIAGNOSIS — J449 Chronic obstructive pulmonary disease, unspecified: Secondary | ICD-10-CM

## 2021-11-28 LAB — CREATININE WITH ESTIMATED GFR
CREATININE: 0.82 mg/dL (ref 0.55–1.02)
ESTIMATED GFR: 72 mL/min/{1.73_m2} (ref >59)

## 2021-11-28 MED ORDER — IOHEXOL 350 MG IODINE/ML INTRAVENOUS SOLUTION
100.0000 mL | INTRAVENOUS | Status: AC
Start: 2021-11-28 — End: 2021-11-28
  Administered 2021-11-28: 75 mL via INTRAVENOUS

## 2021-12-14 ENCOUNTER — Other Ambulatory Visit (HOSPITAL_COMMUNITY): Payer: Self-pay | Admitting: NURSE PRACTITIONER

## 2021-12-14 DIAGNOSIS — J180 Bronchopneumonia, unspecified organism: Secondary | ICD-10-CM

## 2022-01-02 ENCOUNTER — Inpatient Hospital Stay
Admission: RE | Admit: 2022-01-02 | Discharge: 2022-01-02 | Disposition: A | Discharge status: Home / Self Care | Payer: Medicare Other | Source: Ambulatory Visit | Attending: NURSE PRACTITIONER | Admitting: NURSE PRACTITIONER

## 2022-01-02 ENCOUNTER — Other Ambulatory Visit: Payer: Self-pay

## 2022-01-02 DIAGNOSIS — J180 Bronchopneumonia, unspecified organism: Secondary | ICD-10-CM | POA: Insufficient documentation

## 2022-01-02 MED ORDER — IOHEXOL 350 MG IODINE/ML INTRAVENOUS SOLUTION
100.0000 mL | INTRAVENOUS | Status: AC
Start: 2022-01-02 — End: 2022-01-02
  Administered 2022-01-02: 75 mL via INTRAVENOUS

## 2022-01-18 ENCOUNTER — Other Ambulatory Visit: Payer: Self-pay

## 2022-01-24 ENCOUNTER — Telehealth (INDEPENDENT_AMBULATORY_CARE_PROVIDER_SITE_OTHER): Payer: Self-pay | Admitting: Internal Medicine

## 2022-02-02 ENCOUNTER — Other Ambulatory Visit: Payer: Self-pay

## 2022-02-02 ENCOUNTER — Other Ambulatory Visit: Payer: Medicare Other | Attending: Pulmonary Disease

## 2022-02-02 DIAGNOSIS — J9811 Atelectasis: Secondary | ICD-10-CM | POA: Insufficient documentation

## 2022-02-02 LAB — CBC
HCT: 41.9 % (ref 37.0–47.0)
HGB: 14.7 g/dL (ref 12.5–16.0)
MCH: 33.1 pg — ABNORMAL HIGH (ref 27.0–32.0)
MCHC: 35.1 g/dL (ref 32.0–36.0)
MCV: 94.1 fL (ref 78.0–99.0)
MPV: 7.3 fL — ABNORMAL LOW (ref 7.4–10.4)
PLATELETS: 247 10*3/uL (ref 140–440)
RBC: 4.45 10*6/uL (ref 4.20–5.40)
RDW: 12.9 % (ref 11.6–14.8)
WBC: 7.4 10*3/uL (ref 4.0–10.5)
WBCS UNCORRECTED: 7.4 10*3/uL

## 2022-02-02 LAB — BASIC METABOLIC PANEL
ANION GAP: 9 mmol/L — ABNORMAL LOW (ref 10–20)
BUN/CREA RATIO: 15 (ref 6–22)
BUN: 14 mg/dL (ref 7–25)
CALCIUM: 9.3 mg/dL (ref 8.6–10.3)
CHLORIDE: 105 mmol/L (ref 98–107)
CO2 TOTAL: 25 mmol/L (ref 21–31)
CREATININE: 0.93 mg/dL (ref 0.60–1.30)
ESTIMATED GFR: 62 mL/min/{1.73_m2} (ref >59)
GLUCOSE: 98 mg/dL (ref 74–109)
OSMOLALITY, CALCULATED: 278 mOsm/kg (ref 270–290)
POTASSIUM: 4 mmol/L (ref 3.5–5.1)
SODIUM: 139 mmol/L (ref 136–145)

## 2022-02-02 LAB — PT/INR
INR: 1.12 (ref <=5.00)
PROTHROMBIN TIME: 13 seconds — ABNORMAL HIGH (ref 9.8–12.7)

## 2022-02-02 LAB — PTT (PARTIAL THROMBOPLASTIN TIME): APTT: 33.8 seconds (ref 26.0–36.0)

## 2022-02-05 ENCOUNTER — Ambulatory Visit (HOSPITAL_BASED_OUTPATIENT_CLINIC_OR_DEPARTMENT_OTHER): Payer: Medicare Other

## 2022-02-05 ENCOUNTER — Other Ambulatory Visit: Payer: Self-pay

## 2022-02-05 ENCOUNTER — Encounter (HOSPITAL_COMMUNITY): Payer: Medicare Other | Admitting: Pulmonary Disease

## 2022-02-05 ENCOUNTER — Encounter (HOSPITAL_COMMUNITY): Payer: Self-pay | Admitting: Pulmonary Disease

## 2022-02-05 ENCOUNTER — Inpatient Hospital Stay
Admission: RE | Admit: 2022-02-05 | Discharge: 2022-02-05 | Disposition: A | Discharge status: Home / Self Care | Payer: Medicare Other | Source: Ambulatory Visit | Attending: Pulmonary Disease | Admitting: Pulmonary Disease

## 2022-02-05 ENCOUNTER — Encounter (HOSPITAL_COMMUNITY)
Admission: RE | Disposition: A | Discharge status: Home / Self Care | Payer: Self-pay | Source: Ambulatory Visit | Attending: Pulmonary Disease

## 2022-02-05 DIAGNOSIS — J948 Other specified pleural conditions: Secondary | ICD-10-CM

## 2022-02-05 DIAGNOSIS — J479 Bronchiectasis, uncomplicated: Secondary | ICD-10-CM | POA: Insufficient documentation

## 2022-02-05 DIAGNOSIS — J9811 Atelectasis: Secondary | ICD-10-CM | POA: Insufficient documentation

## 2022-02-05 HISTORY — DX: Anxiety disorder, unspecified: F41.9

## 2022-02-05 HISTORY — DX: Chronic obstructive pulmonary disease, unspecified: J44.9

## 2022-02-05 HISTORY — DX: Pure hypercholesterolemia, unspecified: E78.00

## 2022-02-05 HISTORY — DX: Cardiac arrhythmia, unspecified: I49.9

## 2022-02-05 HISTORY — DX: Essential (primary) hypertension: I10

## 2022-02-05 SURGERY — BRONCHOSCOPY FLEXIBLE ADULT
Anesthesia: IV Sedation (Nurse Monitored) | Site: Chest | Wound class: Clean Contaminated Wounds-The respiratory, GI, Genital, or urinary

## 2022-02-05 MED ORDER — ALBUTEROL SULFATE 2.5 MG/3 ML (0.083 %) SOLUTION FOR NEBULIZATION
INHALATION_SOLUTION | RESPIRATORY_TRACT | Status: AC
Start: 2022-02-05 — End: 2022-02-05
  Filled 2022-02-05: qty 3

## 2022-02-05 MED ORDER — LIDOCAINE (PF) 40 MG/ML (4 %) INJECTION SOLUTION (RT USE CONFIG)
INTRAMUSCULAR | Status: AC
Start: 2022-02-05 — End: 2022-02-05
  Filled 2022-02-05: qty 5

## 2022-02-05 MED ORDER — BENZOCAINE 20% METERED DOSE ORAL SPRAY
1.0000 | Freq: Once | ORAL | Status: DC
Start: 2022-02-05 — End: 2022-02-05

## 2022-02-05 MED ORDER — BENZOCAINE 20% METERED DOSE ORAL SPRAY
ORAL | Status: AC
Start: 2022-02-05 — End: 2022-02-05
  Filled 2022-02-05: qty 60

## 2022-02-05 MED ORDER — MIDAZOLAM 5 MG/ML INJECTION WRAPPER
INTRAMUSCULAR | Status: AC
Start: 2022-02-05 — End: 2022-02-05
  Filled 2022-02-05: qty 1

## 2022-02-05 MED ORDER — ALBUTEROL SULFATE 2.5 MG/3 ML (0.083 %) SOLUTION FOR NEBULIZATION
2.5000 mg | INHALATION_SOLUTION | Freq: Once | RESPIRATORY_TRACT | Status: AC
Start: 2022-02-05 — End: 2022-02-05
  Administered 2022-02-05: 2.5 mg via RESPIRATORY_TRACT

## 2022-02-05 MED ORDER — BENZOCAINE 20% METERED DOSE ORAL SPRAY
Freq: Once | ORAL | Status: DC | PRN
Start: 2022-02-05 — End: 2022-02-05
  Administered 2022-02-05: 1 via OROMUCOSAL

## 2022-02-05 MED ORDER — EPINEPHRINE HCL (PF) 1 MG/ML (1 ML) INJECTION SOLUTION
Freq: Once | INTRAMUSCULAR | Status: DC | PRN
Start: 2022-02-05 — End: 2022-02-05
  Administered 2022-02-05: 1 mg via ENDOTRACHEOPULMONARY

## 2022-02-05 MED ORDER — MIDAZOLAM 5 MG/ML INJECTION WRAPPER
2.0000 mg | INTRAMUSCULAR | Status: DC | PRN
Start: 2022-02-05 — End: 2022-02-05
  Administered 2022-02-05: 2 mg via INTRAVENOUS

## 2022-02-05 MED ORDER — FENTANYL (PF) 50 MCG/ML INJECTION SOLUTION
INTRAMUSCULAR | Status: AC
Start: 2022-02-05 — End: 2022-02-05
  Filled 2022-02-05: qty 2

## 2022-02-05 MED ORDER — EPINEPHRINE HCL (PF) 1 MG/ML (1 ML) INJECTION SOLUTION
INTRAMUSCULAR | Status: AC
Start: 2022-02-05 — End: 2022-02-05
  Filled 2022-02-05: qty 1

## 2022-02-05 MED ORDER — LIDOCAINE HCL 10 MG/ML (1 %) INJECTION SOLUTION
INTRAMUSCULAR | Status: AC
Start: 2022-02-05 — End: 2022-02-05
  Filled 2022-02-05: qty 20

## 2022-02-05 MED ORDER — LIDOCAINE (PF) 40 MG/ML (4 %) INJECTION SOLUTION (RT USE CONFIG)
2.0000 mL | Freq: Once | INTRAMUSCULAR | Status: AC
Start: 2022-02-05 — End: 2022-02-05
  Administered 2022-02-05: 80 mg via RESPIRATORY_TRACT

## 2022-02-05 MED ORDER — DEXTROSE 5 % IN WATER (D5W) INTRAVENOUS SOLUTION
INTRAVENOUS | Status: DC
Start: 2022-02-05 — End: 2022-02-05

## 2022-02-05 MED ORDER — LIDOCAINE 2 % MUCOSAL JELLY IN APPLICATOR
Freq: Once | Status: DC | PRN
Start: 2022-02-05 — End: 2022-02-05
  Administered 2022-02-05: 6 mL via ENDOTRACHEOPULMONARY

## 2022-02-05 MED ORDER — LIDOCAINE 2 % MUCOSAL JELLY IN APPLICATOR
Status: AC
Start: 2022-02-05 — End: 2022-02-05
  Filled 2022-02-05: qty 6

## 2022-02-05 MED ORDER — LIDOCAINE HCL 10 MG/ML (1 %) INJECTION SOLUTION
Freq: Once | INTRAMUSCULAR | Status: DC | PRN
Start: 2022-02-05 — End: 2022-02-05
  Administered 2022-02-05: 20 mL

## 2022-02-05 SURGICAL SUPPLY — 4 items
VALVE BIOPSY US BRONCHSCP STRL LF  DISP (ENDOSCOPIC SUPPLIES) ×1 IMPLANT
VALVE BRONCHOSCOPE SUCT MAJ209_20 EA/BX (INSTRUMENTS ENDOMECHANICAL) ×1
VALVE BRONCHOSCOPE SUCT MAJ210_STRL DISP 20EA/BX (INSTRUMENTS ENDOMECHANICAL) ×1
VALVE SUCT FLXB ATTACH BRONCHSCP STRL LF  DISP (ENDOSCOPIC SUPPLIES) ×1 IMPLANT

## 2022-02-05 NOTE — OR PostOp (Signed)
Pt has been ready for discharge, but is waiting on [facility] staff to pick her up

## 2022-02-05 NOTE — Procedures (Signed)
[facility]         Bronchocscopy Proceure Note    Patient Name: Veronica Willis, Veronica Willis [facility] Number: [id number]  Date of Service: 02/05/2022   Date of Birth: Dec 18, 1940      Pre-Operative Diagnosis: PULMONARY ATELECTASIS , bronchiectasis    Post-Operative Diagnosis: small amount of mucous secretions    Procedure(s)/Description:  BRONCHOSCOPY with right middle lobe lavage: [phone] (CPT)     Procedure Note: Pocedure was explained to the patient and remembers and  consent was obtained was taken to endoscopy suite, was connected to continuous pulse ox cardiac monitor and blood pressure monitor, IV sedation was given, bronchoscope was inserted through the oral route, vocal cords were visualized were unremarkable without any tumor growth or paralysis, trachea was visualized next was essentially unremarkable, carina were sharp and midline, bilateral tracheobronchial tree were examined revealed small amount of mucopurulent secretion without any endobronchial lesion, lavage was done from both side in sample was obtained from right middle lobe . Bronchoscope was taken out patient tolerated procedure very well without any complication, patient was transferred to recovery room without any complication.     Findings: small amount of mucous secretions , no endobronchial lesion          Attending Surgeon: Dr. Unk Lightning    Specimens::   ID Type Source Tests Collected by Time Destination   1 : right middle lobe lavage Wash Bronchoalveolar Lavage - Right Middle Lobe CYTOPATHOLOGY, NON GYN, FUNGUS CULTURE, CYTOMEGALOVIRUS (CMV), QUALITATIVE PCR, OTHER SAMPLES, AFB CULTURE WITH STAIN, HERPES SIMPLEX VIRUS, TYPE 1 AND 2 DNA, QUALITATIVE REAL TIME PCR, RESPIRATORY CULTURE AND GRAM STAIN, AEROBIC, LEGIONELLA DNA, QL REAL-TIME PCR Unk Lightning, MD 02/05/2022 1116       Order Name Source Comment Collection Info Order Time   FUNGUS CULTURE Bronchoalveolar Lavage - Right Middle Lobe Pre-op diagnosis:  PULMONARY  ATELECTASIS Collected By: Unk Lightning, MD 02/05/2022 11:16 AM     Release to patient   Automated        AFB CULTURE WITH STAIN Bronchoalveolar Lavage - Right Middle Lobe Pre-op diagnosis:  PULMONARY ATELECTASIS Collected By: Unk Lightning, MD 02/05/2022 11:16 AM     Release to patient   Automated        RESPIRATORY CULTURE AND GRAM STAIN, AEROBIC Bronchoalveolar Lavage - Right Middle Lobe Pre-op diagnosis:  PULMONARY ATELECTASIS Collected By: Unk Lightning, MD 02/05/2022 11:16 AM     Release to patient   Automated        CYTOMEGALOVIRUS (CMV), QUALITATIVE PCR, OTHER SAMPLES Bronchoalveolar Lavage - Right Middle Lobe Pre-op diagnosis:  PULMONARY ATELECTASIS Collected By: Unk Lightning, MD 02/05/2022 11:16 AM     Release to patient   Automated        HERPES SIMPLEX VIRUS, TYPE 1 AND 2 DNA, QUALITATIVE REAL TIME PCR Bronchoalveolar Lavage - Right Middle Lobe Pre-op diagnosis:  PULMONARY ATELECTASIS Collected By: Unk Lightning, MD 02/05/2022 11:16 AM     Release to patient   Automated        LEGIONELLA DNA, QL REAL-TIME PCR Bronchoalveolar Lavage - Right Middle Lobe Pre-op diagnosis:  PULMONARY ATELECTASIS Collected By: Unk Lightning, MD 02/05/2022 11:16 AM     Release to patient   Automated        CYTOPATHOLOGY, NON GYN Bronchoalveolar Lavage - Right Middle Lobe Pre-op diagnosis:  PULMONARY ATELECTASIS Collected By: Unk Lightning, MD 02/05/2022 11:16 AM     Release to patient   Automated  Unk Lightning MD,FCCP,FASM  Board Certified ,[place] and Sleep Medicine

## 2022-02-05 NOTE — Discharge Instructions (Signed)
SURGICAL DISCHARGE INSTRUCTIONS     Dr. Renaye Rakers, MD  performed your BRONCHOSCOPY with right middle lobe lavage today at the [facility]    [facility]:  Monday through Friday from 8 a.m. - 4 p.m.: [phone]    For T&D: [phone]  Between 4 p.m. - 8 a.m., weekends and holidays:  Call ER [phone]    PLEASE SEE WRITTEN HANDOUTS AS DISCUSSED BY YOUR NURSE:     SIGNS AND SYMPTOMS OF A WOUND / INCISION INFECTION   Be sure to watch for the following:  Increase in redness or red streaks near or around the wound or incision.  Increase in pain that is intense or severe and cannot be relieved by the pain medication that your doctor has given you.  Increase in swelling that cannot be relieved by elevation of a body part, or by applying ice, if permitted.  Increase in drainage, or if yellow / green in color and smells bad. This could be on a dressing or a cast.  Increase in fever for longer than 24 hours, or an increase that is higher than 101 degrees Fahrenheit (normal body temperature is 98 degrees Fahrenheit). The incision may feel warm to the touch.    **CALL YOUR DOCTOR IF ONE OR MORE OF THESE SIGNS / SYMPTOMS SHOULD OCCUR.      REMEMBER   If you experience any difficulty breathing, chest pain, bleeding that you feel is excessive, persistent nausea or vomiting or for any other concerns:  Call your physician Dr.  Renaye Rakers, MD   at [phone] . You may also ask to have the general doctor on call paged. They are available to you 24 hours a day.      SPECIAL INSTRUCTIONS / COMMENTS   FINDINGS: SMALL AMOUNT OF MUCOUS SECREATIONS    FOLLOW-UP APPOINTMENTS   Please call your surgeon's office at the number listed to schedule a date / time of return for follow-up.     Dr Renaye Rakers [phone]

## 2022-02-06 DIAGNOSIS — J948 Other specified pleural conditions: Secondary | ICD-10-CM

## 2022-02-06 LAB — CYTOMEGALOVIRUS (CMV), QUALITATIVE PCR, OTHER SAMPLES: CMV DNA,QL REAL TIME PCR: NOT DETECTED

## 2022-02-06 LAB — CYTOPATHOLOGY, NON GYN

## 2022-02-07 LAB — HERPES SIMPLEX VIRUS, TYPE 1 AND 2 DNA, QUALITATIVE REAL TIME PCR
HSV 1 DNA: NOT DETECTED
HSV 2 DNA: NOT DETECTED

## 2022-02-08 LAB — RESPIRATORY CULTURE AND GRAM STAIN (PERFORMABLE)

## 2022-02-09 LAB — LEGIONELLA DNA, QL REAL-TIME PCR
LEGIONELLA PNEUMOPHILA: NOT DETECTED
LEGIONELLA SPECIES: NOT DETECTED

## 2022-03-05 LAB — FUNGUS CULTURE: FUNGAL CULTURE: NO GROWTH

## 2022-03-20 LAB — AFB CULTURE WITH STAIN
AFB CULTURE: NO GROWTH
AFB SMEAR: NEGATIVE

## 2022-05-01 ENCOUNTER — Other Ambulatory Visit: Payer: Self-pay

## 2022-05-01 ENCOUNTER — Ambulatory Visit (INDEPENDENT_AMBULATORY_CARE_PROVIDER_SITE_OTHER): Payer: Medicare Other | Admitting: Family Medicine

## 2022-05-01 ENCOUNTER — Emergency Department (HOSPITAL_COMMUNITY): Payer: Medicare Other

## 2022-05-01 ENCOUNTER — Emergency Department
Admission: EM | Admit: 2022-05-01 | Discharge: 2022-05-01 | Discharge status: Against Medical Advice | Payer: Medicare Other | Attending: Emergency Medicine | Admitting: Emergency Medicine

## 2022-05-01 ENCOUNTER — Encounter (INDEPENDENT_AMBULATORY_CARE_PROVIDER_SITE_OTHER): Payer: Self-pay | Admitting: Family Medicine

## 2022-05-01 VITALS — BP 180/92 | HR 155 | Temp 97.6°F | Resp 20 | Ht 63.0 in | Wt 128.6 lb

## 2022-05-01 DIAGNOSIS — I471 Supraventricular tachycardia, unspecified: Secondary | ICD-10-CM

## 2022-05-01 DIAGNOSIS — I10 Essential (primary) hypertension: Secondary | ICD-10-CM

## 2022-05-01 DIAGNOSIS — N390 Urinary tract infection, site not specified: Secondary | ICD-10-CM | POA: Insufficient documentation

## 2022-05-01 DIAGNOSIS — R0602 Shortness of breath: Secondary | ICD-10-CM | POA: Insufficient documentation

## 2022-05-01 DIAGNOSIS — R42 Dizziness and giddiness: Secondary | ICD-10-CM | POA: Insufficient documentation

## 2022-05-01 DIAGNOSIS — I499 Cardiac arrhythmia, unspecified: Secondary | ICD-10-CM

## 2022-05-01 DIAGNOSIS — I440 Atrioventricular block, first degree: Secondary | ICD-10-CM | POA: Insufficient documentation

## 2022-05-01 DIAGNOSIS — R000 Tachycardia, unspecified: Secondary | ICD-10-CM | POA: Insufficient documentation

## 2022-05-01 DIAGNOSIS — I4719 Other supraventricular tachycardia: Secondary | ICD-10-CM | POA: Insufficient documentation

## 2022-05-01 DIAGNOSIS — Z1152 Encounter for screening for COVID-19: Secondary | ICD-10-CM | POA: Insufficient documentation

## 2022-05-01 DIAGNOSIS — E782 Mixed hyperlipidemia: Secondary | ICD-10-CM

## 2022-05-01 LAB — COMPREHENSIVE METABOLIC PANEL, NON-FASTING
ALBUMIN/GLOBULIN RATIO: 1.3 (ref 0.8–1.4)
ALBUMIN: 4 g/dL (ref 3.5–5.7)
ALKALINE PHOSPHATASE: 74 U/L (ref 34–104)
ALT (SGPT): 15 U/L (ref 7–52)
ANION GAP: 9 mmol/L (ref 4–13)
AST (SGOT): 25 U/L (ref 13–39)
BILIRUBIN TOTAL: 0.6 mg/dL (ref 0.3–1.2)
BUN/CREA RATIO: 13 (ref 6–22)
BUN: 15 mg/dL (ref 7–25)
CALCIUM, CORRECTED: 9 mg/dL (ref 8.9–10.8)
CALCIUM: 9 mg/dL (ref 8.6–10.3)
CHLORIDE: 105 mmol/L (ref 98–107)
CO2 TOTAL: 24 mmol/L (ref 21–31)
CREATININE: 1.18 mg/dL (ref 0.60–1.30)
ESTIMATED GFR: 47 mL/min/{1.73_m2} — ABNORMAL LOW (ref >59)
GLOBULIN: 3.1 (ref 2.9–5.4)
GLUCOSE: 117 mg/dL — ABNORMAL HIGH (ref 74–109)
OSMOLALITY, CALCULATED: 278 mOsm/kg (ref 270–290)
POTASSIUM: 4.6 mmol/L (ref 3.5–5.1)
PROTEIN TOTAL: 7.1 g/dL (ref 6.4–8.9)
SODIUM: 138 mmol/L (ref 136–145)

## 2022-05-01 LAB — ECG 12 LEAD
Atrial Rate: 91 {beats}/min
Calculated P Axis: 94 degrees
Calculated R Axis: -29 degrees
Calculated T Axis: 19 degrees
PR Interval: 206 ms
QRS Duration: 72 ms
QT Interval: 370 ms
QTC Calculation: 455 ms
Ventricular rate: 91 {beats}/min

## 2022-05-01 LAB — CBC WITH DIFF
BASOPHIL #: 0 10*3/uL (ref 0.00–0.10)
BASOPHIL %: 1 % (ref 0–1)
EOSINOPHIL #: 0.1 10*3/uL (ref 0.00–0.50)
EOSINOPHIL %: 1 %
HCT: 42.9 % — ABNORMAL HIGH (ref 31.2–41.9)
HGB: 14.5 g/dL — ABNORMAL HIGH (ref 10.9–14.3)
LYMPHOCYTE #: 1 10*3/uL (ref 1.00–3.00)
LYMPHOCYTE %: 16 % (ref 16–44)
MCH: 32.5 pg (ref 24.7–32.8)
MCHC: 33.9 g/dL (ref 32.3–35.6)
MCV: 95.9 fL — ABNORMAL HIGH (ref 75.5–95.3)
MONOCYTE #: 0.5 10*3/uL (ref 0.30–1.00)
MONOCYTE %: 8 % (ref 5–13)
MPV: 7.4 fL — ABNORMAL LOW (ref 7.9–10.8)
NEUTROPHIL #: 4.7 10*3/uL (ref 1.85–7.80)
NEUTROPHIL %: 74 % (ref 43–77)
PLATELETS: 308 10*3/uL (ref 140–440)
RBC: 4.47 10*6/uL (ref 3.63–4.92)
RDW: 13.9 % (ref 12.3–17.7)
WBC: 6.3 10*3/uL (ref 3.8–11.8)

## 2022-05-01 LAB — URINALYSIS, MICROSCOPIC
HYALINE CASTS: 1 /lpf — ABNORMAL HIGH (ref <0)
RBCS: 2 /hpf (ref <4)
SQUAMOUS EPITHELIAL: 2 /hpf (ref <28)
TRANSITIONAL EPITHELIAL CELLS URINE: <1 /hpf (ref <6)
WBCS: 1 /hpf (ref <6)

## 2022-05-01 LAB — BLOOD BANK HOLD TUBE

## 2022-05-01 LAB — PT/INR
INR: 1.11 (ref <=5.00)
PROTHROMBIN TIME: 12.9 seconds — ABNORMAL HIGH (ref 9.8–12.7)

## 2022-05-01 LAB — COVID-19, FLU A/B, RSV RAPID BY PCR
INFLUENZA VIRUS TYPE A: NOT DETECTED
INFLUENZA VIRUS TYPE B: NOT DETECTED
RESPIRATORY SYNCTIAL VIRUS (RSV): NOT DETECTED
SARS-CoV-2: NOT DETECTED

## 2022-05-01 LAB — C-REACTIVE PROTEIN (CRP): C-REACTIVE PROTEIN (CRP): 0.4 mg/dL (ref 0.1–0.5)

## 2022-05-01 LAB — URINALYSIS, MACROSCOPIC
BILIRUBIN: NEGATIVE mg/dL
BLOOD: NEGATIVE mg/dL
GLUCOSE: NEGATIVE mg/dL
KETONES: NEGATIVE mg/dL
LEUKOCYTES: 25 WBCs/uL — AB
NITRITE: NEGATIVE
PH: 7.5 (ref 5.0–9.0)
PROTEIN: NEGATIVE mg/dL
SPECIFIC GRAVITY: 1.006 (ref 1.002–1.030)
UROBILINOGEN: NORMAL mg/dL

## 2022-05-01 LAB — PTT (PARTIAL THROMBOPLASTIN TIME): APTT: 32.5 seconds (ref 26.0–36.0)

## 2022-05-01 LAB — URINE DRUG SCREEN
AMPHET QL: NEGATIVE
BARB QL: NEGATIVE
BENZO QL: NEGATIVE
BUP QL: NEGATIVE
CANNAQL: NEGATIVE
COCQL: NEGATIVE
FENTANYL, RANDOM URINE: NEGATIVE
OPIATE: NEGATIVE
OXYCODONE URINE: NEGATIVE
PCP QL: NEGATIVE

## 2022-05-01 LAB — ECG W INTERP (AMB USE ONLY)(MUSE,IN CLINIC)
Calculated R Axis: -27 degrees
Calculated T Axis: 31 degrees
QRS Duration: 120 ms
QT Interval: 302 ms
QTC Calculation: 477 ms
Ventricular rate: 150 {beats}/min

## 2022-05-01 LAB — TROPONIN-I
TROPONIN I: 5 ng/L (ref <15)
TROPONIN I: 8 ng/L (ref <15)
TROPONIN I: 26 ng/L — ABNORMAL HIGH (ref <15)

## 2022-05-01 LAB — THYROID STIMULATING HORMONE WITH FREE T4 REFLEX: TSH: 4.27 u[IU]/mL (ref 0.450–5.330)

## 2022-05-01 LAB — LACTIC ACID - FIRST REFLEX: LACTIC ACID: 2.1 mmol/L (ref 0.5–2.2)

## 2022-05-01 LAB — LACTIC ACID LEVEL W/ REFLEX FOR LEVEL >2.0: LACTIC ACID: 2.3 mmol/L — ABNORMAL HIGH (ref 0.5–2.2)

## 2022-05-01 LAB — MAGNESIUM: MAGNESIUM: 1.7 mg/dL — ABNORMAL LOW (ref 1.9–2.7)

## 2022-05-01 LAB — B-TYPE NATRIURETIC PEPTIDE (BNP),PLASMA: BNP: 43 pg/mL (ref 5–100)

## 2022-05-01 MED ORDER — METOPROLOL TARTRATE 5 MG/5 ML INTRAVENOUS SOLUTION
5.0000 mg | INTRAVENOUS | Status: AC
Start: 2022-05-01 — End: 2022-05-01
  Administered 2022-05-01: 5 mg via INTRAVENOUS

## 2022-05-01 MED ORDER — CEFTRIAXONE 1 GRAM SOLUTION FOR INJECTION
INTRAMUSCULAR | Status: AC
Start: 2022-05-01 — End: 2022-05-01
  Filled 2022-05-01: qty 10

## 2022-05-01 MED ORDER — SODIUM CHLORIDE 0.9 % IV BOLUS
2000.0000 mL | INJECTION | Status: AC
Start: 2022-05-01 — End: 2022-05-01
  Administered 2022-05-01: 0 mL via INTRAVENOUS
  Administered 2022-05-01: 2000 mL via INTRAVENOUS

## 2022-05-01 MED ORDER — ADENOSINE 3 MG/ML INTRAVENOUS SOLUTION
6.0000 mg | INTRAVENOUS | Status: AC
Start: 2022-05-01 — End: 2022-05-01
  Administered 2022-05-01: 6 mg via INTRAVENOUS

## 2022-05-01 MED ORDER — ADENOSINE 3 MG/ML INTRAVENOUS SOLUTION
INTRAVENOUS | Status: AC
Start: 2022-05-01 — End: 2022-05-01
  Filled 2022-05-01: qty 10

## 2022-05-01 MED ORDER — SODIUM CHLORIDE 0.9 % INTRAVENOUS PIGGYBACK
INJECTION | INTRAVENOUS | Status: AC
Start: 2022-05-01 — End: 2022-05-01
  Filled 2022-05-01: qty 50

## 2022-05-01 MED ORDER — SODIUM CHLORIDE 0.9 % INTRAVENOUS PIGGYBACK
1.0000 g | INTRAVENOUS | Status: AC
Start: 2022-05-01 — End: 2022-05-01
  Administered 2022-05-01: 1 g via INTRAVENOUS
  Administered 2022-05-01: 0 g via INTRAVENOUS

## 2022-05-01 MED ORDER — MAGNESIUM SULFATE 1 GRAM/100 ML IN DEXTROSE 5 % INTRAVENOUS PIGGYBACK
1.0000 g | INJECTION | INTRAVENOUS | Status: AC
Start: 2022-05-01 — End: 2022-05-01
  Administered 2022-05-01 (×2): 0 g via INTRAVENOUS
  Administered 2022-05-01 (×2): 1 g via INTRAVENOUS

## 2022-05-01 MED ORDER — MAGNESIUM SULFATE 1 GRAM/100 ML IN DEXTROSE 5 % INTRAVENOUS PIGGYBACK
INJECTION | INTRAVENOUS | Status: AC
Start: 2022-05-01 — End: 2022-05-01
  Filled 2022-05-01: qty 200

## 2022-05-01 MED ORDER — METOPROLOL TARTRATE 5 MG/5 ML INTRAVENOUS SOLUTION
INTRAVENOUS | Status: AC
Start: 2022-05-01 — End: 2022-05-01
  Filled 2022-05-01: qty 5

## 2022-05-01 NOTE — ED Triage Notes (Signed)
Rapid HR in the 150-160s - sent by Dr Laverta Baltimore.     PRS: 12 lead, 18g left ac, Normal saline 100 ml

## 2022-05-01 NOTE — Discharge Instructions (Signed)
THE PATIENT IS TO FOLLOW UP WITH THE PRIMARY CARE PROVIDER AS SOON AS POSSIBLE BUT NO LATER THAN 3 DAYS FROM LEAVING THE EMERGENCY DEPARTMENT.  IF NO PRIMARY CARE PROVIDER EXISTS, THEN THE PATIENT IS INSTRUCTED TO ESTABLISH CARE WITH A PRIMARY CARE PROVIDER AS SOON AS POSSIBLE BUT NO LATER THAN 3 DAYS FROM LEAVING THE EMERGENCY DEPARTMENT.  FOLLOW-UP WITH ANY SPECIALIST PROVIDER AS INDICATED AS SOON AS POSSIBLE BUT NO LATER THAN 3 DAYS, IF APPLICABLE.  NOTIFY THE PRIMARY CARE PROVIDER THAT YOU WERE IN THE EMERGENCY DEPARTMENT WITHIN 24 HOURS OF DISCHARGE TO FOLLOW-UP ON YOUR RESULTS AND/OR TREATMENTS.  RETURN TO THE EMERGENCY DEPARTMENT IMMEDIATELY IF NEEDED, NO BETTER, WORSE, NEW SYMPTOMS ARISE, OR YOU CANNOT FOLLOW-UP WITH YOUR PRIMARY CARE PROVIDER AND/OR APPLICABLE SPECIALIST IN THE PRESCRIBED TIMEFRAME.

## 2022-05-01 NOTE — Progress Notes (Signed)
FAMILY MEDICINE, MEDICAL OFFICE BUILDING  [address]  [place] [address]       Name: Veronica Willis MRN:  [id number]   Date: 05/01/2022 Age: 81 y.o.          Provider: Mickey Farber, DO    Reason for visit: New Patient      History of Present Illness:  05/01/2022:  This 81 year old female presents as a new patient anxious today fasting today for labs did not take her beta-blocker her heart rate today is 160 down to 154 with SVT with marked ST abnormality possible inferior subendocardial injury.  I will refer the patient straight to the ER by ambulance.  Historical Data    Past Medical History:  Past Medical History:   Diagnosis Date    Anxiety     Cardiac arrhythmia     Chronic obstructive airway disease (CMS HCC)     Hypercholesterolemia     Hypertension     Hypomagnesemia     Intertrochanteric fracture of left femur (CMS HCC)     Vitamin D deficiency          Past Surgical History:  Past Surgical History:   Procedure Laterality Date    BRONCHOSCOPY      HX CARPAL TUNNEL RELEASE Bilateral     HX CYSTOCELE REPAIR      HX HIP REPLACEMENT Left     HX HIP REPLACEMENT Left          Allergies:  Allergies   Allergen Reactions    Penicillins Nausea/ Vomiting    Sulfa (Sulfonamides) Nausea/ Vomiting     Medications:  Current Outpatient Medications   Medication Sig    albuterol sulfate (VENTOLIN) 5 mg/mL Inhalation Solution for Nebulization Take 0.5 mL (2.5 mg total) by nebulization Three times a day as needed for Wheezing    ergocalciferol, vitamin D2, (DRISDOL) 1,250 mcg (50,000 unit) Oral Capsule Take 1 Capsule (50,000 Units total) by mouth Every 7 days    fluticasone furoate-vilanteroL (BREO ELLIPTA) 100-25 mcg/dose Inhalation Disk with Device Take 1 INHALATION  by inhalation Once a day    guaiFENesin 100 mg/5 mL Oral Liquid Take 20 mL (400 mg total) by mouth Every 4 hours as needed    ipratropium-albuterol 0.5 mg-3 mg(2.5 mg base)/3 mL Solution for Nebulization Take 3 mL by nebulization Every 4 hours as  needed    loratadine (CLARITIN) 10 mg Oral Tablet Take 1 Tablet (10 mg total) by mouth Once a day    losartan (COZAAR) 100 mg Oral Tablet Take 1 Tablet (100 mg total) by mouth Once a day    metoprolol succinate (TOPROL-XL) 25 mg Oral Tablet Sustained Release 24 hr Take 1 Tablet (25 mg total) by mouth Once a day    montelukast (SINGULAIR) 10 mg Oral Tablet Take 1 Tablet (10 mg total) by mouth Every evening    sertraline (ZOLOFT) 100 mg Oral Tablet Take 1 Tablet (100 mg total) by mouth Once a day    simvastatin (ZOCOR) 40 mg Oral Tablet Take 1 Tablet (40 mg total) by mouth Every evening     Family History:  Family Medical History:       Problem Relation (Age of Onset)    Congestive Heart Failure Mother    Diabetes type II Sister    Prostate Cancer Father    Stroke Sister            Social History:  Social History     Socioeconomic History  Marital status: Widowed   Tobacco Use    Smoking status: Never    Smokeless tobacco: Never   Substance and Sexual Activity    Alcohol use: Yes     Comment: SOCIAL occasional wine    Drug use: Never           Review of Systems:  Any pertinent Review of Systems as addressed in the HPI above.    Physical Exam:  Vital Signs:  Vitals:    05/01/22 1059   BP: (!) 180/92   Pulse: (!) 155   Resp: 20   Temp: 36.4 C (97.6 F)   TempSrc: Temporal   SpO2: 95%   Weight: 58.3 kg (128 lb 9.6 oz)   Height: 1.6 m (5\' 3" )   BMI: 22.83     Physical Exam  Constitutional:       Appearance: Normal appearance. She is normal weight.   HENT:      Head: Normocephalic and atraumatic.      Right Ear: Tympanic membrane, ear canal and external ear normal.      Left Ear: Tympanic membrane, ear canal and external ear normal.      Nose: Nose normal.      Mouth/Throat:      Mouth: Mucous membranes are moist.      Pharynx: Oropharynx is clear.   Eyes:      Extraocular Movements: Extraocular movements intact.      Conjunctiva/sclera: Conjunctivae normal.      Pupils: Pupils are equal, round, and reactive to light.    Cardiovascular:      Rate and Rhythm: Regular rhythm. Tachycardia present.      Heart sounds: S1 normal and S2 normal.   Pulmonary:      Effort: Pulmonary effort is normal.      Breath sounds: Normal breath sounds.   Abdominal:      General: Abdomen is flat. Bowel sounds are normal.      Palpations: Abdomen is soft.   Musculoskeletal:         General: Normal range of motion.      Cervical back: Normal range of motion and neck supple.   Skin:     General: Skin is warm and dry.      Capillary Refill: Capillary refill takes less than 2 seconds.   Neurological:      General: No focal deficit present.      Mental Status: She is alert and oriented to person, place, and time. Mental status is at baseline.   Psychiatric:         Mood and Affect: Mood normal.         Behavior: Behavior normal.         Thought Content: Thought content normal.         Judgment: Judgment normal.       Assessment:    ICD-10-CM    1. Tachycardia  R00.0 EKG (93000) - SAME DAY- Performed in Office      2. SVT (supraventricular tachycardia)  I47.10       3. 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 Essential hypertension  I10          Plan:  Orders Placed This Encounter    EKG (93000) - SAME DAY- Performed in Office     I am going to refer the patient to the emergency room spoke to Dr. Rosana Hoes who was in ER.  I attempted carotid massage but this did  not work.  We are going to send the patient via ambulance to hospital.    Return in about 2 weeks (around 05/15/2022).    Mickey Farber, DO     Portions of this note may be dictated using voice recognition software or a dictation service. Variances in spelling and vocabulary are possible and unintentional. Not all errors are caught/corrected. Please notify the Thereasa Parkin if any discrepancies are noted or if the meaning of any statement is not clear.

## 2022-05-01 NOTE — ED Provider Notes (Signed)
[facility]  Emergency Department  Attending Provider Note      CHIEF COMPLAINT  Chief Complaint   Patient presents with    Rapid Heart Rate     HISTORY OF PRESENT ILLNESS  Veronica Willis, date of birth [birth year], is a 81 y.o. female who presented to the Emergency Department.    THE PATIENT PRESENTED TO THE NC DEPARTMENT TODAY FROM THE PRIMARY CARE PROVIDER'S OFFICE AND SVT WITH RAPID RATE.  THE PATIENT WAS HAVING SOME DIZZINESS AND SHORTNESS OF BREATH.  EMS REPORTED BLOOD PRESSURE WAS LOW.  SHE DENIED ANY CHEST PAIN OR COUGH.  NO DYSURIA HEMATURIA NAUSEA VOMITING.  COMPREHENSIVE 10+ REVIEW OF SYSTEMS OTHERWISE NEGATIVE.  SYMPTOMS WERE SEVERE IN NATURE.  NO OTHER ACUTE COMPLAINTS.    PAST MEDICAL/SURGICAL/FAMILY/SOCIAL HISTORY  Past Medical History:   Diagnosis Date    Anxiety     Cardiac arrhythmia     Chronic obstructive airway disease (CMS HCC)     Hypercholesterolemia     Hypertension     Hypomagnesemia     Intertrochanteric fracture of left femur (CMS HCC)     Vitamin D deficiency        Past Surgical History:   Procedure Laterality Date    BRONCHOSCOPY      HX CARPAL TUNNEL RELEASE Bilateral     HX CYSTOCELE REPAIR      HX HIP REPLACEMENT Left     HX HIP REPLACEMENT Left        Family Medical History:       Problem Relation (Age of Onset)    Congestive Heart Failure Mother    Diabetes type II Sister    Prostate Cancer Father    Stroke Sister          Social History     Socioeconomic History    Marital status: Widowed   Tobacco Use    Smoking status: Never    Smokeless tobacco: Never   Substance and Sexual Activity    Alcohol use: Yes     Comment: SOCIAL occasional wine    Drug use: Never      ALLERGIES  Allergies   Allergen Reactions    Penicillins Nausea/ Vomiting    Sulfa (Sulfonamides) Nausea/ Vomiting       PHYSICAL EXAM  VITAL SIGNS:  Filed Vitals:    05/01/22 1500 05/01/22 1515 05/01/22 1530 05/01/22 1600   BP: (!) 145/83 (!) 158/91 (!) 150/78 (!) 142/81   Pulse: 82 93 84 83   Resp: (!)  21 14 (!) 21 16   Temp:       SpO2: 98% 99% 97% 95%     GENERAL: PATIENT IS ALERT AND ORIENTED TO PERSON, PLACE, AND TIME.  HEAD: NORMOCEPHALIC AND ATRAUMATIC.  EYES: PUPILS EQUALLY ROUND AND REACT TO LIGHT. EXTRAOCULAR MOVEMENTS INTACT.  EARS: GROSS HEARING INTACT. EXTERNAL EARS WITHIN NORMAL LIMITS.  NOSE: NO SEPTAL DEVIATION. NASAL PASSAGES CLEAR.  THROAT: MOIST ORAL MUCOSA.   NECK: SUPPLE. TRACHEA MIDLINE.  HEART: REGULAR RHYTHM AND TACHYCARDIC.  LUNGS: CLEAR TO AUSCULTATION BILATERAL BUT DECREASED.  ABDOMEN: SOFT, NON-TENDER, NON-DISTENDED, AND BOWEL SOUNDS ARE PRESENT.  GENITOURINARY: DEFERRED.  RECTAL: DEFERRED.  EXTREMITIES: NO CYANOSIS, CLUBBING, OR EDEMA.  SKIN: WARM AND DRY.  MUSCULOSKELETAL: DEFERRED.  NEUROLOGIC: CRANIAL NERVES II THROUGH XII ARE GROSSLY INTACT. SENSATION TO LIGHT TOUCH IS INTACT.  PSYCHIATRIC: JUDGMENT AND INSIGHT ARE SEEMINGLY INTACT. MOOD AND AFFECT ARE APPROPRIATE FOR THE SITUATION.    PROCEDURES  PATIENT GIVEN ADENOSINE  AND CARDIOVERTED BACK TO NORMAL SINUS RHYTHM.    DIAGNOSTICS  Labs:  Labs listed below were reviewed and interpreted by me.  Results for orders placed or performed during the hospital encounter of 05/01/22   LACTIC ACID LEVEL W/ REFLEX FOR LEVEL >2.0   Result Value Ref Range    LACTIC ACID 2.3 (H) 0.5 - 2.2 mmol/L   MAGNESIUM   Result Value Ref Range    MAGNESIUM 1.7 (L) 1.9 - 2.7 mg/dL   PT/INR   Result Value Ref Range    PROTHROMBIN TIME 12.9 (H) 9.8 - 12.7 seconds    INR 1.11 <=5.00   PTT (PARTIAL THROMBOPLASTIN TIME)   Result Value Ref Range    APTT 32.5 26.0 - 36.0 seconds   THYROID STIMULATING HORMONE WITH FREE T4 REFLEX   Result Value Ref Range    TSH 4.270 0.450 - 5.330 uIU/mL   TROPONIN NOW   Result Value Ref Range    TROPONIN I 5 <15 ng/L   TROPONIN IN ONE HOUR   Result Value Ref Range    TROPONIN I 8 <15 ng/L   TROPONIN IN THREE HOURS   Result Value Ref Range    TROPONIN I 26 (H) <15 ng/L   B-TYPE NATRIURETIC PEPTIDE   Result Value Ref Range    BNP 43 5 -  100 pg/mL   COMPREHENSIVE METABOLIC PANEL, NON-FASTING   Result Value Ref Range    SODIUM 138 136 - 145 mmol/L    POTASSIUM 4.6 3.5 - 5.1 mmol/L    CHLORIDE 105 98 - 107 mmol/L    CO2 TOTAL 24 21 - 31 mmol/L    ANION GAP 9 4 - 13 mmol/L    BUN 15 7 - 25 mg/dL    CREATININE 1.18 0.60 - 1.30 mg/dL    BUN/CREA RATIO 13 6 - 22    ESTIMATED GFR 47 (L) >59 mL/min/1.21m^2    ALBUMIN 4.0 3.5 - 5.7 g/dL    CALCIUM 9.0 8.6 - 10.3 mg/dL    GLUCOSE 117 (H) 74 - 109 mg/dL    ALKALINE PHOSPHATASE 74 34 - 104 U/L    ALT (SGPT) 15 7 - 52 U/L    AST (SGOT) 25 13 - 39 U/L    BILIRUBIN TOTAL 0.6 0.3 - 1.2 mg/dL    PROTEIN TOTAL 7.1 6.4 - 8.9 g/dL    ALBUMIN/GLOBULIN RATIO 1.3 0.8 - 1.4    OSMOLALITY, CALCULATED 278 270 - 290 mOsm/kg    CALCIUM, CORRECTED 9.0 8.9 - 10.8 mg/dL    GLOBULIN 3.1 2.9 - 5.4   COVID-19, FLU A/B, RSV RAPID BY PCR   Result Value Ref Range    SARS-CoV-2 Not Detected Not Detected    INFLUENZA VIRUS TYPE A Not Detected Not Detected    INFLUENZA VIRUS TYPE B Not Detected Not Detected    RESPIRATORY SYNCTIAL VIRUS (RSV) Not Detected Not Detected   C-REACTIVE PROTEIN (CRP)   Result Value Ref Range    C-REACTIVE PROTEIN (CRP) 0.4 0.1 - 0.5 mg/dL   URINALYSIS, MACROSCOPIC   Result Value Ref Range    COLOR Yellow Colorless, Light Yellow, Yellow    APPEARANCE Clear Clear    SPECIFIC GRAVITY 1.006 1.002 - 1.030    PH 7.5 5.0 - 9.0    LEUKOCYTES 25 (A) Negative, 100  WBCs/uL    NITRITE Negative Negative    PROTEIN Negative Negative, 10 , 20  mg/dL    GLUCOSE Negative Negative, 30  mg/dL  KETONES Negative Negative, Trace mg/dL    BILIRUBIN Negative Negative, 0.5 mg/dL    BLOOD Negative Negative, 0.03 mg/dL    UROBILINOGEN Normal Normal mg/dL   URINALYSIS, MICROSCOPIC   Result Value Ref Range    RBCS 2 <4 /hpf    WBCS 1 <6 /hpf    HYALINE CASTS 1 (H) <0 /lpf    SQUAMOUS EPITHELIAL 2 <28 /hpf    TRANSITIONAL EPITHELIAL CELLS URINE <1 <6 /hpf   CBC WITH DIFF   Result Value Ref Range    WBC 6.3 3.8 - 11.8 x10^3/uL    RBC 4.47  3.63 - 4.92 x10^6/uL    HGB 14.5 (H) 10.9 - 14.3 g/dL    HCT 29.9 (H) 24.2 - 41.9 %    MCV 95.9 (H) 75.5 - 95.3 fL    MCH 32.5 24.7 - 32.8 pg    MCHC 33.9 32.3 - 35.6 g/dL    RDW 68.3 41.9 - 62.2 %    PLATELETS 308 140 - 440 x10^3/uL    MPV 7.4 (L) 7.9 - 10.8 fL    NEUTROPHIL % 74 43 - 77 %    LYMPHOCYTE % 16 16 - 44 %    MONOCYTE % 8 5 - 13 %    EOSINOPHIL % 1 %    BASOPHIL % 1 0 - 1 %    NEUTROPHIL # 4.70 1.85 - 7.80 x10^3/uL    LYMPHOCYTE # 1.00 1.00 - 3.00 x10^3/uL    MONOCYTE # 0.50 0.30 - 1.00 x10^3/uL    EOSINOPHIL # 0.10 0.00 - 0.50 x10^3/uL    BASOPHIL # 0.00 0.00 - 0.10 x10^3/uL   LACTIC ACID - FIRST REFLEX   Result Value Ref Range    LACTIC ACID 2.1 0.5 - 2.2 mmol/L   ECG 12 LEAD   Result Value Ref Range    Ventricular rate 91 BPM    Atrial Rate 91 BPM    PR Interval 206 ms    QRS Duration 72 ms    QT Interval 370 ms    QTC Calculation 455 ms    Calculated P Axis 94 degrees    Calculated R Axis -29 degrees    Calculated T Axis 19 degrees   BLOOD BANK HOLD TUBE   Result Value Ref Range    SPECIMEN EXPIRATION DATE 05/04/2022,2359      Radiology:  Results for orders placed or performed during the hospital encounter of 05/01/22   XR AP MOBILE CHEST     Status: None    Narrative    Kalifa S Koogler    RADIOLOGIST: Alvester Chou, MD    XR AP MOBILE CHEST performed on 05/01/2022 12:19 PM    CLINICAL HISTORY: FEVER.  fever/cp    TECHNIQUE: Frontal view of the chest.    COMPARISON:  02/05/2022    FINDINGS:    The heart size is normal.   2 cardiac pads overlying the left mid to lower lung which partially obscures the left chest. In the right perihilar region, there is a triangular-shaped density. 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 No definite acute findings are seen on this portable chest           Impression    NO ACUTE FINDINGS.      Radiologist location ID: [id number]         ED COURSE/MEDICAL  DECISION  MAKING  Medications Administered in the ED   cefTRIAXone (ROCEPHIN) 1 g in NS 50 mL IVPB minibag (1 g Intravenous New Bag/New Syringe 05/01/22 1638)   NS bolus infusion 2,000 mL (0 mL Intravenous Stopped 05/01/22 1400)   adenosine (ADENOCARD) 3 mg/mL injection (6 mg Intravenous Given 05/01/22 1208)   magnesium sulfate 1 G in D5W 100 mL premix IVPB (0 g Intravenous Stopped 05/01/22 1459)   metoprolol (LOPRESSOR) 1 mg/mL injection (5 mg Intravenous Given 05/01/22 1638)      ED Course as of 05/01/22 1706   Tue May 01, 2022   1223 ECG 12 LEAD  TODAY I, Othel Dicostanzo A. Novelle Addair, D.O., PERSONALLY VISUALIZED THE PATIENT'S EKG TRACING.  I AM THE PRIMARY INTERPRETER.  SVT AT A RATE OF 150.  BORDERLINE LEFT AXIS DEVIATION.  BASELINE WANDERING WITH ARTIFACT.  NONSPECIFIC ST-T CHANGES.  NO LVH.  NO BUNDLE-BRANCH BLOCKS.     1223 ECG 12 LEAD  TODAY I, Cheryel Kyte A. Toshika Parrow, D.O., PERSONALLY VISUALIZED THE PATIENT'S EKG TRACING.  I AM THE PRIMARY INTERPRETER.  NORMAL SINUS RHYTHM AT A RATE OF 91.  FIRST-DEGREE AV BLOCK.  BORDERLINE LEFT AXIS DEVIATION.  BASELINE WANDERING WITH ARTIFACT.  NONSPECIFIC ST-T CHANGES.  NO LVH.  PACS NOTED.   1240 XR AP MOBILE CHEST  TODAY I, Zailah Zagami A. Milagro Belmares, D.O., PERSONALLY VISUALIZED THE PATIENT'S EKG TRACING.  I AM THE PRIMARY INTERPRETER. CHRONIC CHANGES   1616 MAGNESIUM(!): 1.7  LOW   1653 CREATININE: 1.18  NORMAL   1653 LACTIC ACID: 2.1  NORMAL      Medical Decision Making  Problems Addressed:  Acute UTI: acute illness or injury  Hypomagnesemia: acute illness or injury  PSVT (paroxysmal supraventricular tachycardia): acute illness or injury    Amount and/or Complexity of Data Reviewed  Labs: ordered. Decision-making details documented in ED Course.  Radiology: ordered and independent interpretation performed. Decision-making details documented in ED Course.  ECG/medicine tests: ordered and independent interpretation performed. Decision-making details documented in ED Course.    Risk  Prescription drug  management.  Decision regarding hospitalization.  Diagnosis or treatment significantly limited by social determinants of health.  Risk Details: LENGTHY DISCUSSION HAD WITH THE PATIENT AND HER DAUGHTER REGARDING ADMISSION.  THE PATIENT REPORTS EXTREME ANXIETY AND DOES NOT WANT TO STAY IN THE HOSPITAL OVERNIGHT.  I EXPLAINED THE CONCERN WITH A RISING TROPONINS IN THE INITIALLY SIGNIFICANT LOW BLOOD PRESSURE.  DESPITE THIS, THE DECISION WAS MADE TO LEAVE AGAINST MEDICAL ADVICE.    Critical Care  Total time providing critical care: 80 minutes      CLINICAL IMPRESSION  Clinical Impression   PSVT (paroxysmal supraventricular tachycardia) (Primary)   Acute UTI   Hypomagnesemia     DISPOSITION  AMA       DISCHARGE MEDICATIONS  Current Discharge Medication List          /Deshae Dickison A. [place], DO, MBA   05/01/2022, 12:24   [facility]  Department of Emergency Medicine  [facility]    This note was partially generated using MModal Fluency Direct system, and there may be some incorrect words, spellings, and punctuation that were not noted in checking the note before saving.

## 2022-05-01 NOTE — ED Nurses Note (Signed)
Patient signed out AMA. Verbalized understanding of signing out. Daughter present at bed side.

## 2022-05-03 LAB — METHADONE, RANDOM URINE: METHADONE URINE: NEGATIVE

## 2022-05-06 LAB — ADULT ROUTINE BLOOD CULTURE, SET OF 2 BOTTLES (BACTERIA AND YEAST)
BLOOD CULTURE, ROUTINE: NO GROWTH
BLOOD CULTURE, ROUTINE: NO GROWTH

## 2022-05-14 ENCOUNTER — Ambulatory Visit (INDEPENDENT_AMBULATORY_CARE_PROVIDER_SITE_OTHER): Payer: Medicare Other | Admitting: Family Medicine

## 2022-05-14 ENCOUNTER — Other Ambulatory Visit: Payer: Self-pay

## 2022-05-14 ENCOUNTER — Encounter (INDEPENDENT_AMBULATORY_CARE_PROVIDER_SITE_OTHER): Payer: Self-pay | Admitting: Family Medicine

## 2022-05-14 VITALS — BP 99/61 | Temp 99.5°F | Resp 19 | Ht 63.0 in | Wt 130.0 lb

## 2022-05-14 DIAGNOSIS — Z23 Encounter for immunization: Secondary | ICD-10-CM

## 2022-05-14 DIAGNOSIS — E782 Mixed hyperlipidemia: Secondary | ICD-10-CM

## 2022-05-14 DIAGNOSIS — R000 Tachycardia, unspecified: Secondary | ICD-10-CM

## 2022-05-14 DIAGNOSIS — M25552 Pain in left hip: Secondary | ICD-10-CM

## 2022-05-14 DIAGNOSIS — I10 Essential (primary) hypertension: Secondary | ICD-10-CM

## 2022-05-14 DIAGNOSIS — I471 Supraventricular tachycardia, unspecified: Secondary | ICD-10-CM

## 2022-05-14 MED ORDER — LOSARTAN 100 MG TABLET
100.0000 mg | ORAL_TABLET | Freq: Every day | ORAL | 1 refills | Status: DC
Start: 2022-05-14 — End: 2022-11-05

## 2022-05-14 MED ORDER — IPRATROPIUM 0.5 MG-ALBUTEROL 3 MG (2.5 MG BASE)/3 ML NEBULIZATION SOLN
3.0000 mL | INHALATION_SOLUTION | RESPIRATORY_TRACT | 1 refills | Status: DC | PRN
Start: 2022-05-14 — End: 2022-08-23

## 2022-05-14 MED ORDER — SIMVASTATIN 40 MG TABLET
40.0000 mg | ORAL_TABLET | Freq: Every evening | ORAL | 1 refills | Status: DC
Start: 2022-05-14 — End: 2022-11-05

## 2022-05-14 MED ORDER — ALBUTEROL SULFATE 2.5 MG/0.5 ML (0.5 %) NEB SOLUTION - CHI
2.5000 mg | INHALATION_SOLUTION | Freq: Three times a day (TID) | RESPIRATORY_TRACT | 0 refills | Status: DC | PRN
Start: 2022-05-14 — End: 2022-06-13

## 2022-05-14 MED ORDER — SERTRALINE 100 MG TABLET
100.0000 mg | ORAL_TABLET | Freq: Every day | ORAL | 1 refills | Status: DC
Start: 2022-05-14 — End: 2022-11-05

## 2022-05-14 MED ORDER — FLUTICASONE FUROATE 100 MCG-VILANTEROL 25 MCG/DOSE INHALATION POWDER
1.0000 | DISK | Freq: Every day | RESPIRATORY_TRACT | 1 refills | Status: AC
Start: 2022-05-14 — End: 2022-11-10

## 2022-05-29 ENCOUNTER — Other Ambulatory Visit: Payer: Self-pay

## 2022-05-31 ENCOUNTER — Ambulatory Visit (HOSPITAL_COMMUNITY)
Admission: RE | Admit: 2022-05-31 | Discharge: 2022-05-31 | Disposition: A | Discharge status: Still In Hospital | Payer: Medicare Other | Source: Ambulatory Visit | Attending: Family Medicine | Admitting: Family Medicine

## 2022-05-31 ENCOUNTER — Other Ambulatory Visit: Payer: Self-pay

## 2022-05-31 DIAGNOSIS — M25552 Pain in left hip: Secondary | ICD-10-CM | POA: Insufficient documentation

## 2022-05-31 NOTE — PT Evaluation (Signed)
[facility]  [place], [address]  [phone]  [phone]      Physical Therapy Lower Extremity Evaluation    Date: 05/31/2022  Patient's Name: Veronica Willis  Date of Birth: 22-Oct-1940    PT diagnosis/Reason for Referral:   L hip pain M25.552  Gait abnormality           SUBJECTIVE  History:  Pt reports that she fell about a year ago and broke her L hip.  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  Pt was unable to recall the specifics about the hip.  She reports she had it done here at [facility] so her information should be in Epic.  Pt has been living at the [facility] this past year and moved out to her own town home recently.  She would like to be able to walk steadier so that she is not a risk to fall while at home by herself.       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    Previous episodes/treatments: yes, therapy    Medications for this problem:  none for the hip other than OTC meds as needed    Diagnostic tests: no recent ones    Patient goals: REDUCE PAIN, NORMALIZE FUNCTION, and IMPROVE BALANCE    Occupation:  Retired    Next MD visit: 08/09/2022    Pain location: at [facility] joint, at hip joint, and at IT band area                    Pain description: DULL, ACHING, and PRESSURE    Pain frequency:  CONTINUOUS    Pain rating: Now 0/10   Best 0/10   Worst 7/10    Pain increases with: EXERCISE, STAND, SIT, and WALK           decreases with : COLD and REST    Weakness: yes    Sleep affected: no    Subjective Functional Reports:  Sitting: LIMITED  Standing: LIMITED  Walking: LIMITED  Lifting: LIMITED      Patient-Specific Functional Score:  Problem Score   1. Walking  - Unable to rate   2. To get up and down out of the floor by herself 0   3. Walk up and down steps 5  (would like to be able to do it without the use of her hands   Total 1.66           OBJECTIVE    AROM   right left   Hip Flexion 110 109   Hip Extension WNL WNL   Hip Abduction WNL WNL   Hip Adduction WNL WNL   Knee Flexion WNL WNL   Knee Extension WNL WNL     Strength     right left   Hip flexion  3+/5 3+/5   Hip extension  3+/5 3+/5   Hip abduction 3+/5 3+/5   Hip adduction 3+/5 3+/5   Knee flexion  5/5 4/5   Knee extension  5/5 5/5   Ankle PF  Double leg heel raises x 10 (poor balance)      Gait: USES ASSISTIVE DEVICE, RIGHT FOOT DOES NOT PASS LEFT FOOT, LEFT FOOT DOES NOT  PASS RIGHT FOOT, WIDE BOS, INITIATION OF GAIT WITH HESITATION, and UNSTEADY and limps slightly on the L leg    Balance:  BERG BALANCE TEST      ITEM DESCRIPTION    SCORE (0-4)   SIT TO STAND    4   STANDING UNSUPPORTED    4   SITTING UNSUPPORTED    4   STANDING TO SITTNG    4   TRANSFERS    3   STANDING WITH EYES CLOSED    2   STANDING WITH FEET TOGETHER    2   REACHING FORWARD WITH OUTSTRETCHED ARMS   2   RETRIEVING OBJECT FROM GROUND    2   TURNING TO LOOK BEHIND    2   TURNING 360 DEGREES    2   PLACING ALTERNATE FOOT ON STOOL    0   STANDING WITH ONE FOOT IN FRONT    0   STANDING ON ONE FOOT    0                                                                        TOTAL 31/56 - medium fall risk   Interpretation:    0-20:   Wheelchair bound    21-40: Walking with assistance    41-56: Independent     Posture: WFL    Treatment provided:REVIEW OF POC AND GOALS WITH PATIENT, ALL QUESTIONS ANSWERED, PATIENT EDUCATION, and THERAPEUTIC EXERCISE       HEP:  Access Code: [email or web address]  URL: [email or web address]  Date: 05/31/2022  Prepared by: Oliver Hum    Exercises  - Standing Hip Abduction with Counter Support  - 1 x daily - 7 x weekly - 3 sets - 10 reps  - Standing Hip Extension with Counter Support  - 1 x daily - 7 x weekly - 3 sets - 10 reps  - Standing March with Counter Support  - 1 x daily - 7 x weekly - 3 sets - 10 reps  - Mini Squat  with Counter Support  - 1 x daily - 7 x weekly - 3 sets - 10 reps        ASSESSMENT    Impression: Veronica Willis is a 81 y/o who is reporting R hip pain and unsteadiness with walking.  She was able to walk independently into the clinic with the use of a single point cane but she hesitated with initiation of gait, took small step lengths, feet turned outwards, limped on the R leg, and her gait/stance was unsteady.  During the evaluation, in addition to unsteadiness with gait, pt also demonstrated LE weakness and pain in her R hip.  Pt would benefit from skilled outpatient physical therapy services.      Rehab potential: FAIR    Short Term Goals: 5 Weeks   - Pt will be able to walk independently in parallel bars without using hands on rails forwards, backwards, and sideways x 3 each direction.   - Pt will improve her LE strength to 4/5 in all muscle groups tested   - Pt will improve her BERG balance score to at least 36 or higher.   - Pt will improve her PSFS average score to 5.  Long Term Goals: 10Weeks  - Pt will be able to walk independently forwards in the hallway for 4 lengths without LOB noted on 3 consecutive sessions.    - Pt will improve her LE strength to 4+/5 in all muscle groups tested   - Pt will improve her BERG balance score to at least 42 or higher.   - Pt will improve her PSFS average score to 7.        PLAN  Patient will attend 2 times per week x 10 weeks. Therapy may include, but is not limited to THERAPEUTIC EXERCISES, TRANSFER/GAIT TRAINING, HOME INSTRUCTIONS, NEURO RE-EDUCATIOIN, and GAIT TRAINING    Plan for next visit    - ambulation in parallel bars forwards, sideways, and backwards   - step up/downs on bench in parallel bars   - LE strengthening exercises    - balance activities   - NuStep     Evaluation complexity:   Personal factors impacting POC: EXTREME YOUTH OR ADVANCED AGE, LIVES ALONE, and FREQUENT OR CHRONIC PAIN   Co-morbidities impacting POC:  none  Complexity of physical exam: INCLUDING  MUSCULOSKELETAL SYSTEM (POSTURE, ROM, STRENGTH, HEIGHT/WEIGHT), INCLUDING NEUROMUSCULAR EXAM (BALANCE, GAIT, LOCOMOTION, MOBILITY), INCLUDING LEVEL OF CONSCIOUSNESS AND ORIENTATION, and REFERRAL IS FOR A CHRONIC PROBLEM   Clinical Presentation: STABLE   Evaluation Complexity: MODERATE-HISTORY 1-2, EXAMINATION 3+, PRESENTATION  EVOLVING/CHANGING      Total Session Time 45, Timed code minutes 15, and Untimed code minutes 30         Intervention minutes: EVALUATION 30 minutes and THERAPEUTIC EXERCISE 15 minutes    Oliver Hum  05/31/2022, 17:22          Start of Service: _________          Certification:    From:______  Through:_________    I certify the need for these services furnished under this plan of treatment and while under my care.    Referring Provider Signature: _______________     Date : _____________________

## 2022-06-04 ENCOUNTER — Other Ambulatory Visit: Payer: Self-pay

## 2022-06-04 ENCOUNTER — Ambulatory Visit (HOSPITAL_COMMUNITY)
Admission: RE | Admit: 2022-06-04 | Discharge: 2022-06-04 | Disposition: A | Discharge status: Still In Hospital | Payer: Medicare Other | Source: Ambulatory Visit

## 2022-06-04 NOTE — PT Treatment (Addendum)
[facility]  Outpatient Physical Therapy  [address]  [place], [address]  [phone]  (Fax) [phone]    Physical Therapy Treatment Note    Date: 06/04/2022  Patient's Name: Veronica Willis  Date of Birth: [birth year]    Visit #/POC:2/20  Authorization:No  POC Signed?: No  POC Ends: 08/16/2022  Next Progress Note Due: 06/29/2022    Evaluating Physical Therapist: Oliver Hum, MSPT  PT diagnosis/Reason for Referral: L hip pain M25.552; Gait abnormality  Next Scheduled Physician Appointment: 08/09/2022  Allergies/Contraindications:  Penicillins and Sulfa;       Subjective: Pt forgot to do her HEP 2 days since the evaluation but she moved to a new home and will try to do better this week.  Pt's goal is to walk steadier so that she is not a risk to fall while at home by herself.        Patient-Specific Functional Score:  Problem Score at Evaluation    1. Walking  - Unable to rate    2. To get up and down out of the floor by herself 0    3. Walk up and down steps 5 (would like to be able to do it without the use of her hands    Average 1.66       Objective:     AROM   At evaluation      right left    Hip Flexion 110 109      Strength   At evaluation      right left    Hip flexion  3+/5 3+/5    Hip extension  3+/5 3+/5    Hip abduction 3+/5 3+/5    Hip adduction 3+/5 3+/5    Knee flexion  5/5 4/5    Knee extension  5/5 5/5    Ankle PF  Double leg heel raises x 10 (poor balance)        Gait:   At evaluation     USES ASSISTIVE DEVICE, RIGHT FOOT DOES NOT PASS LEFT FOOT, LEFT FOOT DOES NOT PASS RIGHT FOOT, WIDE BOS, INITIATION OF GAIT WITH HESITATION, and UNSTEADY and limps slightly on the L leg       Balance:  BERG BALANCE TEST  BERG Total score at Evaluation     31/56 - medium fall risk         Activities/interventions completed in this session are listed below:     EXERCISE/ACTIVITY NAME REPETITIONS RESISTANCE COMPLETED THIS DOS   NuStep  10 min at the end for leg press  strengthening L3 yes   Walking in parallel bars  - forwards  - sideways  - backwards   X4 length in parallel bars  yes   Step Ups   X 10 each side 4 inch  yes   Step downs   X 10 each side 4 inch yes   Lateral step overs X 10 each side  4 inch yes   Standing 3-way SLR while on airex mat  - hip marches  - hip extension  - hip abd  - heel raises  - toe raises  - squats   X 10 each side  - yes  - yes  - yes  - yes  - yes  - yes On airex mat uses B UE on parallel bars yes   Static stance on airex no UE support   30 sec x 3  yes  HEP:  Exercise Code Date given Continue   Standing Hip Abduction with Counter Support  [id number] 05/31/2022 yes   Standing Hip Extension with Counter Support       Standing March with [place] with Counter Support                Assessment: pt tolerated treatment well.  Pt continues to benefit from skilled outpatient physical therapy. Continue to add balance activities to therapy session tolerated.     Short Term Goals: 5 Weeks               - Pt will be able to walk independently in parallel bars without using hands on rails forwards, backwards, and sideways x 3 each direction.               - Pt will improve her LE strength to 4/5 in all muscle groups tested               - Pt will improve her BERG balance score to at least 36 or higher.               - Pt will improve her PSFS average score to 5.     Long Term Goals: 10Weeks  - Pt will be able to walk independently forwards in the hallway for 4 lengths without LOB noted on 3 consecutive sessions.                - Pt will improve her LE strength to 4+/5 in all muscle groups tested               - Pt will improve her BERG balance score to at least 42 or higher.               - Pt will improve her PSFS average score to 7.    Plan: Continue with POC.     Total Session Time 40 and Timed code minutes 40  THERAPEUTIC EXERCISE 30 minutes and NEURO RE-EDUCATION 10MINUTES      Darrol Jump   06/04/2022, 10:10

## 2022-06-06 ENCOUNTER — Ambulatory Visit (HOSPITAL_COMMUNITY)
Admission: RE | Admit: 2022-06-06 | Discharge: 2022-06-06 | Disposition: A | Discharge status: Still In Hospital | Payer: Medicare Other | Source: Ambulatory Visit

## 2022-06-06 ENCOUNTER — Other Ambulatory Visit: Payer: Self-pay

## 2022-06-06 NOTE — PT Treatment (Addendum)
[facility]  Outpatient Physical Therapy  [address]  [place], [address]  [phone]  (Fax) [phone]    Physical Therapy Treatment Note    Date: 06/06/2022  Patient's Name: Veronica Willis  Date of Birth: 15-Apr-1941    Visit #/POC:3/20  Authorization:No  POC Signed?: No  POC Ends: 08/16/2022  Next Progress Note Due: 06/29/2022     Evaluating Physical Therapist: Oliver Hum, MSPT  PT diagnosis/Reason for Referral: L hip pain M25.552; Gait abnormality (post hip replacement about 1 year ago)   Next Scheduled Physician Appointment: 08/09/2022  Allergies/Contraindications:  Penicillins and Sulfa;         Subjective: Pt was able to do her HEP following last visit and the exercises are just right.  Pt's goal is to walk steadier so that she is not a risk to fall while at home by herself.         Patient-Specific Functional Score:  Problem Score at Evaluation     1. Walking  - Unable to rate     2. To get up and down out of the floor by herself 0     3. Walk up and down steps 5 (would like to be able to do it without the use of her hands     Average 1.66        Objective:      AROM          At evaluation       right left     Hip Flexion 110 109        Strength          At evaluation       right left     Hip flexion  3+/5 3+/5     Hip extension  3+/5 3+/5     Hip abduction 3+/5 3+/5     Hip adduction 3+/5 3+/5     Knee flexion  5/5 4/5     Knee extension  5/5 5/5     Ankle PF  Double leg heel raises x 10 (poor balance)         Gait:   At evaluation  06/06/2022     USES ASSISTIVE DEVICE, RIGHT FOOT DOES NOT PASS LEFT FOOT, LEFT FOOT DOES NOT PASS RIGHT FOOT, WIDE BOS, INITIATION OF GAIT WITH HESITATION, and UNSTEADY and limps slightly on the L leg  using cane to and from therapy; continues to use small steps with feet in ER, initiation of gait with hesitation.  Still slightly unsteady.         Balance:  BERG BALANCE TEST  BERG Total score at Evaluation       31/56 - medium fall risk             Activities/interventions completed in this session are listed below:      EXERCISE/ACTIVITY NAME REPETITIONS RESISTANCE COMPLETED THIS DOS   NuStep  5 min at beginning of session for leg press strengthening - with therapist supervision and instruction L5 yes   Walking in parallel bars  - forwards  - sideways  - backwards    Yes - Each   2 x 4 length(one with 1# weights and one without weights)     0-1# yes   Step Ups    X 10 each side 6 inch  yes   Step downs    X 10 each side 6 inch yes   Lateral step overs X  10 each side  6 inch yes   Standing 3-way SLR while on airex mat    - hip marches  - hip extension  - hip abd  - heel raises  - toe raises  - squats    Holding rail with 1 hand and 1# weight in other for balance  - yes, 2 x 10  - yes x 10  - yes x 10  - yes x 10  - yes x 10  - no On airex mat uses 1 hand on 1# weight and 1 hand on rail yes   Static stance on airex no UE support    30 sec x 3   yes                                                             HEP:  Exercise Code Date given Continue   Standing Hip Abduction with Counter Support  [id number] 05/31/2022 yes   Standing Hip Extension with Counter Support       Standing March with Counter Support        Mini Squat with Counter Support                    Assessment: pt tolerated treatment well.  Pt continues to benefit from skilled outpatient physical therapy. Continue to add balance activities to therapy session tolerated.  Continue to challenge her to let go by giving her her a weight to hold or to do something with her UE to distract her from using them with activities.     Short Term Goals: 5 Weeks               - Pt will be able to walk independently in parallel bars without using hands on rails forwards, backwards, and sideways x 3 each direction.               - Pt will improve her LE strength to 4/5 in all muscle groups tested               - Pt will improve her BERG balance score to at least 36 or higher.               - Pt will improve  her PSFS average score to 5.     Long Term Goals: 10Weeks  - Pt will be able to walk independently forwards in the hallway for 4 lengths without LOB noted on 3 consecutive sessions.                - Pt will improve her LE strength to 4+/5 in all muscle groups tested               - Pt will improve her BERG balance score to at least 42 or higher.               - Pt will improve her PSFS average score to 7.     Plan: Continue with POC.        Total Session Time 30 and Timed code minutes 30  THERAPEUTIC EXERCISE 15 minutes and NEURO RE-EDUCATION      Oliver Hum  06/06/2022, 10;18

## 2022-06-11 ENCOUNTER — Other Ambulatory Visit: Payer: Self-pay

## 2022-06-11 ENCOUNTER — Ambulatory Visit (HOSPITAL_COMMUNITY)
Admission: RE | Admit: 2022-06-11 | Discharge: 2022-06-11 | Disposition: A | Discharge status: Still In Hospital | Payer: Medicare Other | Source: Ambulatory Visit

## 2022-06-11 NOTE — PT Treatment (Addendum)
[facility]  Outpatient Physical Therapy  [address]  [place], [address]  [phone]  (Fax) [phone]    Physical Therapy Treatment Note    Date: 06/11/2022  Patient's Name: Veronica Willis  Date of Birth: Aug 27, 1940    Visit #/POC:4/20  Authorization:No  POC Signed?: No  POC Ends: 08/16/2022  Next Progress Note Due: 06/29/2022     Evaluating Physical Therapist: Oliver Hum, MSPT  PT diagnosis/Reason for Referral: L hip pain M25.552; Gait abnormality (post hip replacement about 1 year ago)   Next Scheduled Physician Appointment: 08/09/2022  Allergies/Contraindications:  Penicillins and Sulfa;         Subjective: Pt reports that therapy is helping her feel stronger in her legs and steadier on her feet.      Patient-Specific Functional Score:  Problem Score at Evaluation     1. Walking  - Unable to rate     2. To get up and down out of the floor by herself 0     3. Walk up and down steps 5 (would like to be able to do it without the use of her hands     Average 1.66        Objective:      AROM              At evaluation       right left     Hip Flexion 110 109        Strength              At evaluation       right left     Hip flexion  3+/5 3+/5     Hip extension  3+/5 3+/5     Hip abduction 3+/5 3+/5     Hip adduction 3+/5 3+/5     Knee flexion  5/5 4/5     Knee extension  5/5 5/5     Ankle PF  Double leg heel raises x 10 (poor balance)         Gait:   At evaluation  06/06/2022     USES ASSISTIVE DEVICE, RIGHT FOOT DOES NOT PASS LEFT FOOT, LEFT FOOT DOES NOT PASS RIGHT FOOT, WIDE BOS, INITIATION OF GAIT WITH HESITATION, and UNSTEADY and limps slightly on the L leg  using cane to and from therapy; continues to use small steps with feet in ER, initiation of gait with hesitation.  Still slightly unsteady.         Balance:  BERG BALANCE TEST  BERG Total score at Evaluation       31/56 - medium fall risk            Activities/interventions completed in this session are listed  below:      EXERCISE/ACTIVITY NAME REPETITIONS RESISTANCE COMPLETED THIS DOS   NuStep  5 min at beginning of session for leg press strengthening - with therapist supervision and instruction L5 no   Walking in parallel bars  - forwards  - sideways  - backwards    Yes - Each   2 x 4 length(one with 1# weights and one without weights)      0-1# no   Step Ups    X 20 each side 6 inch  yes   Step downs    X 20 each side 6 inch yes   Lateral step overs X 10 each side  6 inch no   Standing 3-way SLR while on  airex mat     - hip marches  - hip extension (hamstring curls)   - hip abd  - heel raises  - toe raises  - squats    Holding rail with 1 hand and 2# weight in other for balance  - yes,x 20  - yes x 20  - yes x 20  - yes x 20  - yes x 20  - no On airex mat uses 1 hand on 2# weight and 1 hand on rail;  2.5# on each leg yes   Static stance on airex no UE support    30 sec x 3   no                                                             HEP:  Exercise Code Date given Continue   Standing Hip Abduction with Counter Support  [id number] 05/31/2022 yes   Standing Hip Extension with Counter Support       Standing March with Counter Support        Mini Squat with Counter Support                    Assessment: pt tolerated treatment well.  Pt continues to benefit from skilled outpatient physical therapy. Continue to add balance activities to therapy session tolerated.  Continue to challenge her to let go by giving her her a weight to hold or to do something with her UE to distract her from using them with activities.     Short Term Goals: 5 Weeks               - Pt will be able to walk independently in parallel bars without using hands on rails forwards, backwards, and sideways x 3 each direction.               - Pt will improve her LE strength to 4/5 in all muscle groups tested               - Pt will improve her BERG balance score to at least 36 or higher.               - Pt will improve her PSFS average score to 5.     Long  Term Goals: 10Weeks  - Pt will be able to walk independently forwards in the hallway for 4 lengths without LOB noted on 3 consecutive sessions.                - Pt will improve her LE strength to 4+/5 in all muscle groups tested               - Pt will improve her BERG balance score to at least 42 or higher.               - Pt will improve her PSFS average score to 7.     Plan: Continue with POC.       Total Session Time 30 and Timed code minutes 30  THERAPEUTIC EXERCISE 15 minutes and NEURO RE-EDUCATION      Oliver Hum  06/11/2022, 11:00

## 2022-06-12 ENCOUNTER — Other Ambulatory Visit (INDEPENDENT_AMBULATORY_CARE_PROVIDER_SITE_OTHER): Payer: Self-pay | Admitting: Family Medicine

## 2022-06-12 DIAGNOSIS — J449 Chronic obstructive pulmonary disease, unspecified: Secondary | ICD-10-CM

## 2022-06-14 ENCOUNTER — Other Ambulatory Visit: Payer: Self-pay

## 2022-06-14 ENCOUNTER — Ambulatory Visit (HOSPITAL_COMMUNITY)
Admission: RE | Admit: 2022-06-14 | Discharge: 2022-06-14 | Disposition: A | Discharge status: Still In Hospital | Payer: Medicare Other | Source: Ambulatory Visit

## 2022-06-14 NOTE — PT Treatment (Addendum)
[facility]  Outpatient Physical Therapy  [address]  [place], [address]  [phone]  (Fax) [phone]    Physical Therapy Treatment Note    Date: 06/14/2022  Patient's Name: Veronica Willis  Date of Birth: [birth year]    Visit #/POC:5/20  Authorization:No  POC Signed?: No  POC Ends: 08/16/2022  Next Progress Note Due: 06/29/2022     Evaluating Physical Therapist: Oliver Hum, MSPT  PT diagnosis/Reason for Referral: L hip pain M25.552; Gait abnormality (post hip replacement about 1 year ago)   Next Scheduled Physician Appointment: 08/09/2022  Allergies/Contraindications:  Penicillins and Sulfa;         Subjective: Pt reports that therapy is helping her feel stronger in her legs and steadier on her feet.      Patient-Specific Functional Score:  Problem Score at Evaluation     1. Walking  - Unable to rate     2. To get up and down out of the floor by herself 0     3. Walk up and down steps 5 (would like to be able to do it without the use of her hands     Average 1.66        Objective:      AROM              At evaluation       right left     Hip Flexion 110 109        Strength              At evaluation       right left     Hip flexion  3+/5 3+/5     Hip extension  3+/5 3+/5     Hip abduction 3+/5 3+/5     Hip adduction 3+/5 3+/5     Knee flexion  5/5 4/5     Knee extension  5/5 5/5     Ankle PF  Double leg heel raises x 10 (poor balance)         Gait:   At evaluation  06/06/2022     USES ASSISTIVE DEVICE, RIGHT FOOT DOES NOT PASS LEFT FOOT, LEFT FOOT DOES NOT PASS RIGHT FOOT, WIDE BOS, INITIATION OF GAIT WITH HESITATION, and UNSTEADY and limps slightly on the L leg  using cane to and from therapy; continues to use small steps with feet in ER, initiation of gait with hesitation.  Still slightly unsteady.         Balance:  BERG BALANCE TEST  BERG Total score at Evaluation       31/56 - medium fall risk            Activities/interventions completed in this session are listed  below:      EXERCISE/ACTIVITY NAME REPETITIONS RESISTANCE COMPLETED THIS DOS   NuStep  5 min at beginning of session for leg press strengthening - with therapist supervision and instruction L5 no   Walking in parallel bars  - forwards  - sideways  - backwards    Yes - Each   2 x 4 length(one with 1# weights and one without weights)      0-1# no   Step Ups    X 20 each side 6 inch  no   Step downs    X 20 each side 6 inch no   Lateral step overs X 10 each side  6 inch no   Standing 3-way SLR while on  airex mat     - hip marches  - hip extension (hamstring curls)   - hip abd    - heel raises  - toe raises  - squats          - yes,x 20  - yes 2 x 10       - yes 2 x 10 (mod A from therapist)   - yes x 10 (max A)  - yes x 10 (max A)  - yes x 10 (min A) 1# in UE bilaterally and 1.5# on each LE; therapist providing min A for balance yes   Static stance on airex no UE support    45 sec x 3   yes                                                             HEP:  Exercise Code Date given Continue   Standing Hip Abduction with Counter Support  [id number] 05/31/2022 yes   Standing Hip Extension with Counter Support       Standing March with Counter Support        Mini Squat with Counter Support                    Assessment: pt tolerated treatment well.  Pt continues to benefit from skilled outpatient physical therapy. Continue to add balance activities to therapy session tolerated.  Continue to challenge her to let go by giving her her a weight to hold or to do something with her UE to distract her from using them with activities.     Short Term Goals: 5 Weeks               - Pt will be able to walk independently in parallel bars without using hands on rails forwards, backwards, and sideways x 3 each direction.               - Pt will improve her LE strength to 4/5 in all muscle groups tested               - Pt will improve her BERG balance score to at least 36 or higher.               - Pt will improve her PSFS average score to  5.     Long Term Goals: 10Weeks  - Pt will be able to walk independently forwards in the hallway for 4 lengths without LOB noted on 3 consecutive sessions.                - Pt will improve her LE strength to 4+/5 in all muscle groups tested               - Pt will improve her BERG balance score to at least 42 or higher.               - Pt will improve her PSFS average score to 7.     Plan: Continue with POC.       Total Session Time 30 and Timed code minutes 30  THERAPEUTIC EXERCISE 15 minutes and NEURO RE-EDUCATION      Oliver Hum  06/14/2022, 11:00

## 2022-06-19 ENCOUNTER — Ambulatory Visit (HOSPITAL_COMMUNITY)
Admission: RE | Admit: 2022-06-19 | Discharge: 2022-06-19 | Disposition: A | Discharge status: Still In Hospital | Payer: Medicare Other | Source: Ambulatory Visit

## 2022-06-19 ENCOUNTER — Other Ambulatory Visit: Payer: Self-pay

## 2022-06-19 NOTE — PT Treatment (Addendum)
[facility]  Outpatient Physical Therapy  [address]  [place], [address]  [phone]  (Fax) [phone]    Physical Therapy Treatment Note    Date: 06/19/2022  Patient's Name: Veronica Willis  Date of Birth: 1941-03-13    Visit #/POC:6/20  Authorization:No  POC Signed?: No  POC Ends: 08/16/2022  Next Progress Note Due: 06/29/2022     Evaluating Physical Therapist: Oliver Hum, MSPT  PT diagnosis/Reason for Referral: L hip pain M25.552; Gait abnormality (post hip replacement about 1 year ago)   Next Scheduled Physician Appointment: 08/09/2022  Allergies/Contraindications:  Penicillins and Sulfa;      Subjective: Pt reports that when she wakes up in the mornings, she really has to work her L leg for awhile before it will work for her.  She is unsure why and if it will always be that way.  Pt reports that therapy is helping her feel stronger in her legs and steadier on her feet.      Patient-Specific Functional Score:  Problem Score at Evaluation     1. Walking  - Unable to rate     2. To get up and down out of the floor by herself 0     3. Walk up and down steps 5 (would like to be able to do it without the use of her hands     Average 1.66        Objective:      AROM              At evaluation       right left     Hip Flexion 110 109        Strength              At evaluation       right left     Hip flexion  3+/5 3+/5     Hip extension  3+/5 3+/5     Hip abduction 3+/5 3+/5     Hip adduction 3+/5 3+/5     Knee flexion  5/5 4/5     Knee extension  5/5 5/5     Ankle PF  Double leg heel raises x 10 (poor balance)         Gait:   At evaluation  06/06/2022     USES ASSISTIVE DEVICE, RIGHT FOOT DOES NOT PASS LEFT FOOT, LEFT FOOT DOES NOT PASS RIGHT FOOT, WIDE BOS, INITIATION OF GAIT WITH HESITATION, and UNSTEADY and limps slightly on the L leg  using cane to and from therapy; continues to use small steps with feet in ER, initiation of gait with hesitation.  Still slightly unsteady.          Balance:  BERG BALANCE TEST  BERG Total score at Evaluation       31/56 - medium fall risk            Activities/interventions completed in this session are listed below:      EXERCISE/ACTIVITY NAME REPETITIONS RESISTANCE COMPLETED THIS DOS   NuStep  6 min at beginning of session for leg press strengthening - with therapist supervision and instruction L5 yes   Walking in parallel bars  - forwards  - sideways  - backwards    Yes - Each   2 x 4 length(one with 1# weights and one without weights)      0-1# no   Step Ups    X 20 each side 6  inch  no   Step downs    X 20 each side 6 inch no   Lateral step overs X 10 each side  6 inch no   Standing 3-way SLR while on airex mat     - hip marches  - hip extension (hamstring curls)   - hip abd  - heel raises  - toe raises  - squats             - yes,x 20  - yes x20        - yes x 20  - yes x 20   - yes x 20   - yes x 20   Today 2# on legs, 1# in hand, and 1 hand on rail,   (In past sessions - 1# in UE bilaterally and 1.5# on each LE; therapist providing min A for balance) yes   Static stance on airex no UE support    45 sec x 3   no    Amb in clinic   - forwards  -sideways  -backwards       - 2 laps in clinic PT department with CGA  - sideways with CGA 50 ft each  - 20 ft backwards with 2 HHA       yes                                             HEP:  Exercise Code Date given Continue   Standing Hip Abduction with Counter Support  [id number] 05/31/2022 yes   Standing Hip Extension with Counter Support       Standing March with Counter Support        Mini Squat with Counter Support                    Assessment:  pt tolerated treatment well.  Pt continues to benefit from skilled outpatient physical therapy. Continue to add balance activities to therapy session tolerated.  Continue to challenge her to let go by giving her her a weight to hold or to do something with her UE to distract her from using them with activities.     Short Term Goals: 5 Weeks               - Pt  will be able to walk independently in parallel bars without using hands on rails forwards, backwards, and sideways x 3 each direction.               - Pt will improve her LE strength to 4/5 in all muscle groups tested               - Pt will improve her BERG balance score to at least 36 or higher.               - Pt will improve her PSFS average score to 5.     Long Term Goals: 10Weeks  - Pt will be able to walk independently forwards in the hallway for 4 lengths without LOB noted on 3 consecutive sessions.                - Pt will improve her LE strength to 4+/5 in all muscle groups tested               - Pt will improve her BERG balance  score to at least 42 or higher.               - Pt will improve her PSFS average score to 7.     Plan: Continue with POC.     Total Session Time 45 and Timed code minutes 45  THERAPEUTIC EXERCISE 15 minutes, GAIT TRAINING 15 MINUTES, and NEURO RE-EDUCATION      Oliver Hum  06/19/2022, 10:15

## 2022-06-25 ENCOUNTER — Other Ambulatory Visit: Payer: Self-pay

## 2022-06-25 ENCOUNTER — Ambulatory Visit (HOSPITAL_COMMUNITY)
Admission: RE | Admit: 2022-06-25 | Discharge: 2022-06-25 | Disposition: A | Discharge status: Still In Hospital | Payer: Medicare Other | Source: Ambulatory Visit

## 2022-06-25 NOTE — PT Treatment (Addendum)
[facility]  Outpatient Physical Therapy  [place], [address]  [phone]  [phone]    Physical Therapy Treatment Note  PROGRESS NOTE    Date: 06/25/2022  Patient's Name: Veronica Willis  Date of Birth: Dec 18, 1940     Visit #/POC:7/20  Authorization:No  POC Signed?: No  POC Ends: 08/16/2022  Next Progress Note Due: 07/25/2022     Evaluating Physical Therapist: Darrol Jump, MSPT  PT diagnosis/Reason for Referral: L hip pain M25.552; Gait abnormality (post hip replacement about 1 year ago)   Next Scheduled Physician Appointment: 08/09/2022  Allergies/Contraindications:  Penicillins and Sulfa;      Subjective: Pt reports that therapy is helping her.  Pt has made 100% progress since initial eval.  Pt is now walking without her cane at home and in the community if she feels safe.  Pt reports that therapy is helping her feel stronger in her legs and steadier on her feet.      Patient-Specific Functional Score:  Problem Score at Evaluation  06/25/2022   1. Walking  - Unable to rate  5   2. To get up and down out of the floor by herself 0  5   3. Walk up and down steps 5 (would like to be able to do it without the use of her hands 5   Average 1.66  5      Objective:      AROM              At evaluation       right left     Hip Flexion 110 109        Strength              At evaluation  06/25/2022      right left  bilateral   Hip flexion  3+/5 3+/5  4/5   Hip extension  3+/5 3+/5  4/5   Hip abduction 3+/5 3+/5  4/5   Hip adduction 3+/5 3+/5  4/5   Knee flexion  5/5 4/5  4/5   Knee extension  5/5 5/5  5/5   Ankle PF  Double leg heel raises x 10 (poor balance)   NT      Gait:   At evaluation  06/06/2022  06/25/2022   USES ASSISTIVE DEVICE, RIGHT FOOT DOES NOT PASS LEFT FOOT, LEFT FOOT DOES NOT PASS RIGHT FOOT, WIDE BOS, INITIATION OF GAIT WITH HESITATION, and UNSTEADY and limps slightly on the L leg  using cane to and from therapy; continues to use small steps with  feet in ER, initiation of gait with hesitation.  Still slightly unsteady.   ambulates without a cane; still uses small step lengths and is unsteady but has improved significantly; mild initiation of gait with hesitation      Balance:  BERG BALANCE TEST  BERG Total score at Evaluation  06/25/2022     31/56 - medium fall risk  45/56         Activities/interventions completed in this session are listed below:      EXERCISE/ACTIVITY NAME REPETITIONS RESISTANCE COMPLETED THIS DOS   NuStep  6 min at beginning of session for leg press strengthening - with therapist supervision and instruction L5 yes   Walking in parallel bars  - forwards  - sideways  - backwards    Yes - Each   2 x 4 length(one with 1# weights and one without weights)  0-1# no   Step Ups    X 20 each side 6 inch with UE support yes   Step downs    X 20 each side 6 inch with UE support  no   Lateral step overs X 10 each side  6 inch with UE support no   Standing 3-way SLR while on airex mat     - hip marches  - hip extension (hamstring curls)   - hip abd  - heel raises  - toe raises  - squats             - yes, 2 x 20  - yes  2 x20        - yes x 20  - yes x 20   - yes x 20   - yes x 20    Today 2# on legs, 1# in hand, and 1 hand on rail,   (In past sessions - 1# in UE bilaterally and 1.5# on each LE; therapist providing min A for balance) yes   Static stance on airex no UE support    45 sec x 3   no    Amb in clinic   - forwards  -sideways  -backwards       - 2 laps in clinic PT department with CGA  - sideways with CGA 50 ft each  - 20 ft backwards with 2 HHA    no       standing in tandem   needs assist to get into the position   15 sec x 1 each side    yes                                HEP:  Exercise Code Date given Continue   Standing Hip Abduction with Counter Support  [id number] 05/31/2022 yes   Standing Hip Extension with Counter Support       Standing March with Counter Support        Mini Squat with Counter Support                    Assessment:   pt tolerated treatment well.  Pt continues to benefit from skilled outpatient physical therapy. Continue to add balance activities to therapy session tolerated.  Continue to challenge her to let go by giving her her a weight to hold or to do something with her UE to distract her from using them with activities. PROGRESS NOTE - Pt has attended 7/20 visits.  Pt has made excellent progress.  Pt has met 4/4 STGs and 0/4 LTGs.  Pt reports significant improvement since initial evaluation.  Pt can now functionally do walk without her cane but continues to demonstrate instability with ambulation and activities.  Pt is still at a risk for falling and continued therapy is recommended to improve stability, strength, and quality of gait.  Pt would continue to benefit from skilled outpatient physical therapy services.        Short Term Goals: 5 Weeks               - Pt will be able to walk independently in parallel bars without using hands on rails forwards, backwards, and sideways x 3 each direction. GOAL MET, 06/25/2022               - Pt will improve her LE strength to 4/5 in all muscle groups tested GOAL  MET, 06/25/2022               - Pt will improve her BERG balance score to at least 36 or higher. GOAL MET, 06/25/2022               - Pt will improve her PSFS average score to 5.  GOAL MET, 06/25/2022     Long Term Goals: 10Weeks  - Pt will be able to walk independently forwards in the hallway for 4 lengths without LOB noted on 3 consecutive sessions. PROGRESSING, 06/25/2022               - Pt will improve her LE strength to 4+/5 in all muscle groups tested PROGRESSING, 06/25/2022               - Pt will improve her BERG balance score to at least 42 or higher. GOAL MET, 06/25/2022               - Pt will improve her PSFS average score to 7. PROGRESSING, 06/25/2022     Plan: Continue with POC.        Total Session Time 45 and Timed code minutes 45  THERAPEUTIC EXERCISE 30 minutes and NEURO RE-EDUCATION 15MINUTES      Darrol Jump  06/25/2022, 11:00

## 2022-06-27 ENCOUNTER — Ambulatory Visit
Admission: RE | Admit: 2022-06-27 | Discharge: 2022-06-27 | Disposition: A | Discharge status: Still In Hospital | Payer: Medicare Other | Source: Ambulatory Visit | Attending: Family Medicine | Admitting: Family Medicine

## 2022-06-27 ENCOUNTER — Other Ambulatory Visit: Payer: Self-pay

## 2022-06-27 NOTE — PT Treatment (Signed)
[facility]  Outpatient Physical Therapy  [place], [address]  [phone]  [phone]    Physical Therapy Treatment Note    Date: 06/27/2022  Patient's Name: Veronica Willis  Date of Birth: [birth year]    Visit #/POC:8/20  Authorization:No  POC Signed?: No  POC Ends: 08/16/2022  Next Progress Note Due: 07/25/2022     Evaluating Physical Therapist: Darrol Jump, MSPT  PT diagnosis/Reason for Referral: L hip pain M25.552; Gait abnormality (post hip replacement about 1 year ago)   Next Scheduled Physician Appointment: 08/09/2022  Allergies/Contraindications:  Penicillins and Sulfa;      Subjective: Pt reports that therapy is helping her.  Pt continues to walk without her cane at home and in the community if she feels safe.  Pt reports that therapy is helping her feel stronger in her legs and steadier on her feet.      Patient-Specific Functional Score:  Problem Score at Evaluation  06/25/2022   1. Walking  - Unable to rate  5   2. To get up and down out of the floor by herself 0  5   3. Walk up and down steps 5 (would like to be able to do it without the use of her hands 5   Average 1.66  5      Objective:      AROM              At evaluation       right left     Hip Flexion 110 109        Strength              At evaluation  06/25/2022      right left  bilateral   Hip flexion  3+/5 3+/5  4/5   Hip extension  3+/5 3+/5  4/5   Hip abduction 3+/5 3+/5  4/5   Hip adduction 3+/5 3+/5  4/5   Knee flexion  5/5 4/5  4/5   Knee extension  5/5 5/5  5/5   Ankle PF  Double leg heel raises x 10 (poor balance)   NT      Gait:   At evaluation  06/06/2022  06/25/2022   USES ASSISTIVE DEVICE, RIGHT FOOT DOES NOT PASS LEFT FOOT, LEFT FOOT DOES NOT PASS RIGHT FOOT, WIDE BOS, INITIATION OF GAIT WITH HESITATION, and UNSTEADY and limps slightly on the L leg  using cane to and from therapy; continues to use small steps with feet in ER, initiation of gait with hesitation.  Still  slightly unsteady.   ambulates without a cane; still uses small step lengths and is unsteady but has improved significantly; mild initiation of gait with hesitation      Balance:  BERG BALANCE TEST  BERG Total score at Evaluation  06/25/2022     31/56 - medium fall risk  45/56         Activities/interventions completed in this session are listed below:      EXERCISE/ACTIVITY NAME REPETITIONS RESISTANCE COMPLETED THIS DOS   NuStep  6 min at beginning of session for leg press strengthening - with therapist supervision and instruction L5 no   Walking in parallel bars  - forwards  - sideways  - backwards    Yes - Each   2 x 4 length(one with 1# weights and one without weights)      0-1# no   Step Ups  X 20 each side 6 inch with UE support no   Step downs    X 20 each side 6 inch with UE support  no   Lateral step overs X 10 each side  6 inch with UE support no   Standing 3-way SLR while on airex mat     - hip marches      - hip extension (hamstring curls)   - hip abd  - heel raises      - toe raises  - squats          - yes, 2 x 10 (with alternating arm flexion 1#)   - yes  2 x 10 (one set hams curls; one set hip extend   - yes x 10  - yes x 10       - yes x 10   - yes x 10 Today 2# on legs, 1# in hands; no rails - with therapist providing min A (using gait belt)     - yes x 10  - yes x 10 with overhead press  - yes x 10  - yes x 10 yes   Static stance on airex no UE support    45 sec x 3   no    Amb in clinic   - forwards  -sideways  -backwards       - 2 laps in clinic PT department with CGA  - sideways with CGA 50 ft each  - 20 ft backwards with 2 HHA    no       standing in tandem   needs assist to get into the position   15 sec x 1 each side    no                                HEP:  Exercise Code Date given Continue   Standing Hip Abduction with Counter Support  [id number] 05/31/2022 yes   Standing Hip Extension with Counter Support       Standing March with Counter Support        Mini Squat with Counter Support                     Assessment:  Pt tolerated treatment well.  Pt continues to benefit from skilled outpatient physical therapy. Continue to add balance activities to therapy session tolerated.  Continue to challenge her to let go by giving her her a weight to hold or to do something with her UE to distract her from using them with activities.      Short Term Goals: 5 Weeks               - Pt will be able to walk independently in parallel bars without using hands on rails forwards, backwards, and sideways x 3 each direction. GOAL MET, 06/25/2022               - Pt will improve her LE strength to 4/5 in all muscle groups tested GOAL MET, 06/25/2022               - Pt will improve her BERG balance score to at least 36 or higher. GOAL MET, 06/25/2022               - Pt will improve her PSFS average score to 5.  GOAL MET, 06/25/2022     Long Term Goals:  10Weeks  - Pt will be able to walk independently forwards in the hallway for 4 lengths without LOB noted on 3 consecutive sessions. PROGRESSING, 06/25/2022               - Pt will improve her LE strength to 4+/5 in all muscle groups tested PROGRESSING, 06/25/2022               - Pt will improve her BERG balance score to at least 42 or higher. GOAL MET, 06/25/2022               - Pt will improve her PSFS average score to 7. PROGRESSING, 06/25/2022     Plan: Continue with POC.       Total Session Time 45 and Timed code minutes [place]  06/27/2022, 11:00

## 2022-07-02 ENCOUNTER — Ambulatory Visit (HOSPITAL_COMMUNITY)
Admission: RE | Admit: 2022-07-02 | Discharge: 2022-07-02 | Disposition: A | Discharge status: Still In Hospital | Payer: Medicare Other | Source: Ambulatory Visit

## 2022-07-02 ENCOUNTER — Other Ambulatory Visit: Payer: Self-pay

## 2022-07-02 DIAGNOSIS — M25552 Pain in left hip: Secondary | ICD-10-CM | POA: Insufficient documentation

## 2022-07-02 NOTE — PT Treatment (Signed)
[facility]  Outpatient Physical Therapy  [place], [address]  [phone]  [phone]    Physical Therapy Treatment Note    Date: 07/02/2022  Patient's Name: Veronica Willis  Date of Birth: 29-May-1941    Visit #/POC:9/20  Authorization:No  POC Signed?: No  POC Ends: 08/16/2022  Next Progress Note Due: 07/25/2022     Evaluating Physical Therapist: Darrol Jump, MSPT  PT diagnosis/Reason for Referral: L hip pain M25.552; Gait abnormality (post hip replacement about 1 year ago)   Next Scheduled Physician Appointment: 08/09/2022  Allergies/Contraindications:  Penicillins and Sulfa;      Subjective: Pt reports that therapy is helping her.  Pt is now walking without her cane all the time.      Patient-Specific Functional Score:  Problem Score at Evaluation  06/25/2022   1. Walking  - Unable to rate  5   2. To get up and down out of the floor by herself 0  5   3. Walk up and down steps 5 (would like to be able to do it without the use of her hands 5   Average 1.66  5      Objective:      AROM              At evaluation       right left     Hip Flexion 110 109        Strength              At evaluation  06/25/2022      right left  bilateral   Hip flexion  3+/5 3+/5  4/5   Hip extension  3+/5 3+/5  4/5   Hip abduction 3+/5 3+/5  4/5   Hip adduction 3+/5 3+/5  4/5   Knee flexion  5/5 4/5  4/5   Knee extension  5/5 5/5  5/5   Ankle PF  Double leg heel raises x 10 (poor balance)   NT      Gait:   At evaluation  06/06/2022  06/25/2022   USES ASSISTIVE DEVICE, RIGHT FOOT DOES NOT PASS LEFT FOOT, LEFT FOOT DOES NOT PASS RIGHT FOOT, WIDE BOS, INITIATION OF GAIT WITH HESITATION, and UNSTEADY and limps slightly on the L leg  using cane to and from therapy; continues to use small steps with feet in ER, initiation of gait with hesitation.  Still slightly unsteady.   ambulates without a cane; still uses small step lengths and is unsteady but has improved significantly; mild  initiation of gait with hesitation      Balance:  BERG BALANCE TEST  BERG Total score at Evaluation  06/25/2022     31/56 - medium fall risk  45/56         Activities/interventions completed in this session are listed below:      EXERCISE/ACTIVITY NAME REPETITIONS RESISTANCE COMPLETED THIS DOS   NuStep  6 min at beginning of session for leg press strengthening - with therapist supervision and instruction L5 no   Walking in parallel bars  - forwards  - sideways  - backwards    Yes - Each   2 x 4 length(one with 1# weights and one without weights)      0-1# no   Step Ups    X 20 each side 6 inch with UE support no   Step downs    X 20 each side 6 inch with  UE support  no   Lateral step overs X 10 each side  6 inch with UE support no   Standing 3-way SLR while on airex mat     - hip marches        - hip extension (hamstring curls)   - hip abd  - heel raises        - toe raises  - squats          - yes, 2 x 10 (with alternating arm flexion 1#)   - yes  2 x 10 (one set hams curls; one set hip extend   - yes x 10  - yes x 10 with overhead press      - yes x 10   - yes x 10 Today 2# on legs, 1# in hands; no rails - with therapist providing min A       yes   Static stance on airex no UE support    45 sec x 3   no    Amb in clinic   - forwards  -sideways  -backwards       - 2 laps in clinic PT department independently  - sideways with SPV 50 ft x 2 each  - 50 ft x 2 ft backwards with SPV   yes       standing in tandem   needs assist to get into the position   15 sec x 1 each side    no    quick stair tap alternating feet      x 15 holding both hands, x 15 holding on R and, x 15 holding on L     yes                   HEP:  Exercise Code Date given Continue   Standing Hip Abduction with Counter Support  [id number] 05/31/2022 yes   Standing Hip Extension with Counter Support       Standing March with Counter Support        Mini Squat with Counter Support                    Assessment:  Pt tolerated treatment well.  Pt continues  to benefit from skilled outpatient physical therapy. Continue to add balance activities to therapy session tolerated.  Continue to challenge her to let go by giving her her a weight to hold or to do something with her UE to distract her from using them with activities.      Short Term Goals: 5 Weeks               - Pt will be able to walk independently in parallel bars without using hands on rails forwards, backwards, and sideways x 3 each direction. GOAL MET, 06/25/2022               - Pt will improve her LE strength to 4/5 in all muscle groups tested GOAL MET, 06/25/2022               - Pt will improve her BERG balance score to at least 36 or higher. GOAL MET, 06/25/2022               - Pt will improve her PSFS average score to 5.  GOAL MET, 06/25/2022     Long Term Goals: 10Weeks  - Pt will be able to walk independently forwards in the hallway for 4 lengths without  LOB noted on 3 consecutive sessions. PROGRESSING, 06/25/2022               - Pt will improve her LE strength to 4+/5 in all muscle groups tested PROGRESSING, 06/25/2022               - Pt will improve her BERG balance score to at least 42 or higher. GOAL MET, 06/25/2022               - Pt will improve her PSFS average score to 7. PROGRESSING, 06/25/2022     Plan: Continue with POC.       Total Session Time 45 and Timed code minutes 45  THERAPEUTIC EXERCISE 15 minutes and NEURO RE-EDUCATION 30MINUTES      Darrol Jump  07/02/2022, 11:00

## 2022-07-05 ENCOUNTER — Ambulatory Visit (HOSPITAL_COMMUNITY)
Admission: RE | Admit: 2022-07-05 | Discharge: 2022-07-05 | Disposition: A | Discharge status: Still In Hospital | Payer: Medicare Other | Source: Ambulatory Visit

## 2022-07-05 ENCOUNTER — Other Ambulatory Visit: Payer: Self-pay

## 2022-07-05 NOTE — PT Treatment (Signed)
[facility]  Outpatient Physical Therapy  [place], [address]  [phone]  [phone]    Physical Therapy Treatment Note    Date: 07/05/2022  Patient's Name: Veronica Willis  Date of Birth: Feb 04, 1941    Visit #/POC:10/20  Authorization:No  POC Signed?: No  POC Ends: 08/16/2022  Next Progress Note Due: 07/25/2022     Evaluating Physical Therapist: Darrol Jump, MSPT  PT diagnosis/Reason for Referral: L hip pain M25.552; Gait abnormality (post hip replacement about 1 year ago)   Next Scheduled Physician Appointment: 08/09/2022  Allergies/Contraindications:  Penicillins and Sulfa;      Subjective: Pt reports that therapy is helping her.  Pt is now walking without her cane all the time.       Patient-Specific Functional Score:  Problem Score at Evaluation  06/25/2022   1. Walking  - Unable to rate  5   2. To get up and down out of the floor by herself 0  5   3. Walk up and down steps 5 (would like to be able to do it without the use of her hands 5   Average 1.66  5      Objective:      AROM              At evaluation       right left     Hip Flexion 110 109        Strength              At evaluation  06/25/2022      right left  bilateral   Hip flexion  3+/5 3+/5  4/5   Hip extension  3+/5 3+/5  4/5   Hip abduction 3+/5 3+/5  4/5   Hip adduction 3+/5 3+/5  4/5   Knee flexion  5/5 4/5  4/5   Knee extension  5/5 5/5  5/5   Ankle PF  Double leg heel raises x 10 (poor balance)   NT      Gait:   At evaluation  06/06/2022  06/25/2022   USES ASSISTIVE DEVICE, RIGHT FOOT DOES NOT PASS LEFT FOOT, LEFT FOOT DOES NOT PASS RIGHT FOOT, WIDE BOS, INITIATION OF GAIT WITH HESITATION, and UNSTEADY and limps slightly on the L leg  using cane to and from therapy; continues to use small steps with feet in ER, initiation of gait with hesitation.  Still slightly unsteady.   ambulates without a cane; still uses small step lengths and is unsteady but has improved significantly; mild  initiation of gait with hesitation      Balance:  BERG BALANCE TEST  BERG Total score at Evaluation  06/25/2022     31/56 - medium fall risk  45/56         Activities/interventions completed in this session are listed below:      EXERCISE/ACTIVITY NAME REPETITIONS RESISTANCE COMPLETED THIS DOS   NuStep  6 min at beginning of session for leg press strengthening - with therapist supervision and instruction L5 (legs only)  yes   Walking in parallel bars  - forwards  - sideways  - backwards    Yes - Each   2 x 4 length(one with 1# weights and one without weights)      0-1# no   Step Ups    X 20 each side 6 inch with UE support no   Step downs    X 20 each  side 6 inch with UE support  no   Lateral step overs X 10 each side  6 inch with UE support no   Standing 3-way SLR while on airex mat     - hip marches     - hip extension (hamstring curls)   - hip abd  - heel raises     - toe raises  - squats          - yes, 2 x 10 (with alternating arm flexion 1#)   - yes  2 x 10 (one set hams curls; one set hip extend     - yes x 10  - yes x 10 with overhead press   - yes x 10   - yes x 10 Today 2# on legs, 1# in hands; no rails - with therapist providing min A        yes   Static stance on airex no UE support    45 sec x 3   no    Amb in clinic   - forwards  -sideways  -backwards       - 2 laps in clinic PT department independently  - sideways with SPV 50 ft x 2 each  - 50 ft x 2 ft backwards with SPV   yes       standing in tandem   needs assist to get into the position   15 sec x 1 each side    no    quick stair tap alternating feet      x 15 holding both hands, x 15 holding on R and, x 15 holding on L     no                   HEP:  Exercise Code Date given Continue   Standing Hip Abduction with Counter Support  [id number] 05/31/2022 yes   Standing Hip Extension with Counter Support       Standing March with Counter Support        Mini Squat with Counter Support                    Assessment:  Pt tolerated treatment well.  Pt  continues to benefit from skilled outpatient physical therapy. Continue to add balance activities to therapy session tolerated.  Continue to challenge her to let go by giving her her a weight to hold or to do something with her UE to distract her from using them with activities.      Short Term Goals: 5 Weeks               - Pt will be able to walk independently in parallel bars without using hands on rails forwards, backwards, and sideways x 3 each direction. GOAL MET, 06/25/2022               - Pt will improve her LE strength to 4/5 in all muscle groups tested GOAL MET, 06/25/2022               - Pt will improve her BERG balance score to at least 36 or higher. GOAL MET, 06/25/2022               - Pt will improve her PSFS average score to 5.  GOAL MET, 06/25/2022     Long Term Goals: 10Weeks  - Pt will be able to walk independently forwards in the hallway for 4 lengths without LOB noted  on 3 consecutive sessions. PROGRESSING, 06/25/2022               - Pt will improve her LE strength to 4+/5 in all muscle groups tested PROGRESSING, 06/25/2022               - Pt will improve her BERG balance score to at least 42 or higher. GOAL MET, 06/25/2022               - Pt will improve her PSFS average score to 7. PROGRESSING, 06/25/2022     Plan: Continue with POC.     Total Session Time 45 and Timed code minutes 45  THERAPEUTIC EXERCISE 15 minutes, GAIT TRAINING 15 MINUTES, and NEURO RE-EDUCATION 15MINUTES      Darrol Jump  07/05/2022, 11:00

## 2022-07-09 ENCOUNTER — Other Ambulatory Visit: Payer: Self-pay

## 2022-07-09 ENCOUNTER — Ambulatory Visit (HOSPITAL_COMMUNITY)
Admission: RE | Admit: 2022-07-09 | Discharge: 2022-07-09 | Disposition: A | Discharge status: Still In Hospital | Payer: Medicare Other | Source: Ambulatory Visit

## 2022-07-09 NOTE — PT Treatment (Signed)
[facility]  Outpatient Physical Therapy  [place], [address]  [phone]  [phone]    Physical Therapy Treatment Note    Date: 07/09/2022  Patient's Name: Veronica Willis  Date of Birth: 1940/11/07    Visit #/POC:11/20  Authorization:No  POC Signed?: No  POC Ends: 08/16/2022  Next Progress Note Due: 07/25/2022     Evaluating Physical Therapist: Darrol Jump, MSPT  PT diagnosis/Reason for Referral: L hip pain M25.552; Gait abnormality (post hip replacement about 1 year ago)   Next Scheduled Physician Appointment: 08/09/2022  Allergies/Contraindications:  Penicillins and Sulfa;      Subjective: Pt reports that therapy is helping her.  Pt is now walking without her cane all the time.       Patient-Specific Functional Score:  Problem Score at Evaluation  06/25/2022   1. Walking  - Unable to rate  5   2. To get up and down out of the floor by herself 0  5   3. Walk up and down steps 5 (would like to be able to do it without the use of her hands 5   Average 1.66  5      Objective:      AROM              At evaluation       right left     Hip Flexion 110 109        Strength              At evaluation  06/25/2022      right left  bilateral   Hip flexion  3+/5 3+/5  4/5   Hip extension  3+/5 3+/5  4/5   Hip abduction 3+/5 3+/5  4/5   Hip adduction 3+/5 3+/5  4/5   Knee flexion  5/5 4/5  4/5   Knee extension  5/5 5/5  5/5   Ankle PF  Double leg heel raises x 10 (poor balance)   NT      Gait:   At evaluation  06/06/2022  06/25/2022   USES ASSISTIVE DEVICE, RIGHT FOOT DOES NOT PASS LEFT FOOT, LEFT FOOT DOES NOT PASS RIGHT FOOT, WIDE BOS, INITIATION OF GAIT WITH HESITATION, and UNSTEADY and limps slightly on the L leg  using cane to and from therapy; continues to use small steps with feet in ER, initiation of gait with hesitation.  Still slightly unsteady.   ambulates without a cane; still uses small step lengths and is unsteady but has improved significantly; mild  initiation of gait with hesitation      Balance:  BERG BALANCE TEST  BERG Total score at Evaluation  06/25/2022     31/56 - medium fall risk  45/56         Activities/interventions completed in this session are listed below:      EXERCISE/ACTIVITY NAME REPETITIONS RESISTANCE COMPLETED THIS DOS   NuStep  6 min at beginning of session for leg press strengthening - with therapist supervision and instruction L5 (legs only)  yes   Walking in parallel bars  - forwards  - sideways  - backwards    Yes - Each   2 x 4 length(one with 1# weights and one without weights)      0-1# no   Step Ups    X 20 each side 6 inch with UE support no   Step downs    X 20 each  side 6 inch with UE support  no   Lateral step overs X 10 each side  6 inch with UE support no   Standing 3-way SLR while on airex mat     - hip marches     - hip extension (hamstring curls)   - hip abd  - heel raises     - toe raises  - squats          - yes, 2 x 10 (with alternating arm flexion 1#)   - yes  2 x 10 (one set hams curls; one set hip extend     - yes x 10  - yes x 10 with overhead press   - yes x 10   - yes x 10 Today 2# on legs, 1# in hands; no rails - with therapist providing min A        yes   Static stance on airex no UE support    45 sec x 3   no    Amb in clinic   - forwards  -sideways  -backwards       - 2 laps in clinic PT department independently  - sideways with SPV 50 ft x 2 each  - 50 ft x 2 ft backwards with SPV   no       standing in tandem   needs assist to get into the position   15 sec x 1 each side    no    quick stair tap alternating feet      x 15 holding both hands, x 15 holding on R and, x 15 holding on L     no       walking over obstacles  in parallel bars holding weights in hands  - forwards  -sideways      X4 CGA  X4 CGA  yes   Walk on balance beam    X4 CGA   biodex   No (next visit)       HEP:  Exercise Code Date given Continue   Standing Hip Abduction with Counter Support  [id number] 05/31/2022 yes   Standing Hip Extension with  Counter Support       Standing March with Counter Support        Mini Squat with Counter Support                    Assessment:  Pt tolerated treatment well.  Pt continues to benefit from skilled outpatient physical therapy. Continue to add balance activities to therapy session tolerated.  Continue to challenge her to let go by giving her her a weight to hold or to do something with her UE to distract her from using them with activities.      Short Term Goals: 5 Weeks               - Pt will be able to walk independently in parallel bars without using hands on rails forwards, backwards, and sideways x 3 each direction. GOAL MET, 06/25/2022               - Pt will improve her LE strength to 4/5 in all muscle groups tested GOAL MET, 06/25/2022               - Pt will improve her BERG balance score to at least 36 or higher. GOAL MET, 06/25/2022               -  Pt will improve her PSFS average score to 5.  GOAL MET, 06/25/2022     Long Term Goals: 10Weeks  - Pt will be able to walk independently forwards in the hallway for 4 lengths without LOB noted on 3 consecutive sessions. PROGRESSING, 06/25/2022               - Pt will improve her LE strength to 4+/5 in all muscle groups tested PROGRESSING, 06/25/2022               - Pt will improve her BERG balance score to at least 42 or higher. GOAL MET, 06/25/2022               - Pt will improve her PSFS average score to 7. PROGRESSING, 06/25/2022     Plan: Continue with POC.       Total Session Time 35 and Timed code minutes 35  THERAPEUTIC EXERCISE 35 minutes      Darrol Jump  07/09/2022, 11:40

## 2022-07-12 ENCOUNTER — Other Ambulatory Visit: Payer: Self-pay

## 2022-07-12 ENCOUNTER — Ambulatory Visit (HOSPITAL_COMMUNITY)
Admission: RE | Admit: 2022-07-12 | Discharge: 2022-07-12 | Disposition: A | Discharge status: Still In Hospital | Payer: Medicare Other | Source: Ambulatory Visit

## 2022-07-12 NOTE — PT Treatment (Signed)
[facility]  Outpatient Physical Therapy  [place], [address]  [phone]  [phone]    Physical Therapy Treatment Note    Date: 07/12/2022  Patient's Name: Veronica Willis  Date of Birth: Feb 07, 1941    Visit #/POC:12/20  Authorization:No  POC Signed?: No  POC Ends: 08/16/2022  Next Progress Note Due: 07/25/2022     Evaluating Physical Therapist: Darrol Jump, MSPT  PT diagnosis/Reason for Referral: L hip pain M25.552; Gait abnormality (post hip replacement about 1 year ago)   Next Scheduled Physician Appointment: 08/09/2022  Allergies/Contraindications:  Penicillins and Sulfa;      Subjective: Pt reports that therapy is helping her.  Pt is now walking without her cane all the time.       Patient-Specific Functional Score:  Problem Score at Evaluation  06/25/2022   1. Walking  - Unable to rate  5   2. To get up and down out of the floor by herself 0  5   3. Walk up and down steps 5 (would like to be able to do it without the use of her hands 5   Average 1.66  5      Objective:      AROM               At evaluation  07/12/2022     right left  Right Left   Hip Flexion 110 109  110  110      Strength                At evaluation  06/25/2022  07/12/2022     right left  bilateral Right left   Hip flexion  3+/5 3+/5  4/5 5/5 4/5   Hip extension  3+/5 3+/5  4/5 NT NT   Hip abduction 3+/5 3+/5  4/5 NT NT   Hip adduction 3+/5 3+/5  4/5 NT NT   Knee flexion  5/5 4/5  4/5 NT NT   Knee extension  5/5 5/5  5/5 NT NT   Ankle PF  Double leg heel raises x 10 (poor balance)   NT Double leg raise 2 x 10 with min A for balance NT      Gait:   At evaluation  06/06/2022  06/25/2022 07/12/2022   USES ASSISTIVE DEVICE, RIGHT FOOT DOES NOT PASS LEFT FOOT, LEFT FOOT DOES NOT PASS RIGHT FOOT, WIDE BOS, INITIATION OF GAIT WITH HESITATION, and UNSTEADY and limps slightly on the L leg  using cane to and from therapy; continues to use small steps with feet in ER, initiation of gait with  hesitation.  Still slightly unsteady.   ambulates without a cane; still uses small step lengths and is unsteady but has improved significantly; mild initiation of gait with hesitation Amb without a cane; improved step length and steadiness; no hesitation with initiation of gait      Balance:  BERG BALANCE TEST  BERG Total score at Evaluation  06/25/2022     31/56 - medium fall risk  45/56         Activities/interventions completed in this session are listed below:      EXERCISE/ACTIVITY NAME REPETITIONS RESISTANCE COMPLETED THIS DOS   NuStep  6 min at beginning of session for leg press strengthening - with therapist supervision and instruction L5 (legs only)  no   Walking in parallel bars  - forwards  - sideways  - backwards    Yes -  Each   2 x 4 length(one with 1# weights and one without weights)      0-1# no   Step Ups    X 20 each side 6 inch with UE support no   Step downs    X 20 each side 6 inch with UE support  no   Lateral step overs X 10 each side  6 inch with UE support no   Standing 3-way SLR while on airex mat     - hip marches     - hip extension (hamstring curls)   - hip abd  - heel raises     - toe raises  - squats          - yes, 2 x 10 (with alternating arm flexion 1#)   - yes  2 x 10 (one set hams curls; one set hip extend     - yes 2 x 10  - yes 2 x 10 with overhead press   - yes x 10   - yes x 10 Today 2# on legs, 1# in hands; no rails - with therapist providing min A        yes   Static stance on airex no UE support    45 sec x 3   no    Amb in clinic   - forwards  -sideways  -backwards       - 2 laps in clinic PT department independently  - sideways with SPV 50 ft x 2 each  - 50 ft x 2 ft backwards with SPV   no       standing in tandem   needs assist to get into the position   15 sec x 1 each side    no    quick stair tap alternating feet      x 15 holding both hands, x 15 holding on R and, x 15 holding on L     no       walking over obstacles  in parallel bars holding weights in hands  -  forwards  -sideways       X4 CGA  X4 CGA      - yes  - yes    Walk on balance beam      X4 CGA   biodex     No (next visit)       HEP:  Exercise Code Date given Continue   Standing Hip Abduction with Counter Support  [id number] 05/31/2022 yes   Standing Hip Extension with Counter Support       Standing March with Counter Support        Mini Squat with Counter Support                    Assessment:  Pt tolerated treatment well.  Pt continues to benefit from skilled outpatient physical therapy.      Short Term Goals: 5 Weeks               - Pt will be able to walk independently in parallel bars without using hands on rails forwards, backwards, and sideways x 3 each direction. GOAL MET, 06/25/2022               - Pt will improve her LE strength to 4/5 in all muscle groups tested GOAL MET, 06/25/2022               - Pt will improve her BERG balance  score to at least 36 or higher. GOAL MET, 06/25/2022               - Pt will improve her PSFS average score to 5.  GOAL MET, 06/25/2022     Long Term Goals: 10Weeks  - Pt will be able to walk independently forwards in the hallway for 4 lengths without LOB noted on 3 consecutive sessions. PROGRESSING, 06/25/2022               - Pt will improve her LE strength to 4+/5 in all muscle groups tested PROGRESSING, 06/25/2022               - Pt will improve her BERG balance score to at least 42 or higher. GOAL MET, 06/25/2022               - Pt will improve her PSFS average score to 7. PROGRESSING, 06/25/2022     Plan: Continue with POC.        Total Session Time 30 and Timed code minutes 30  THERAPEUTIC EXERCISE 15 minutes and NEURO RE-EDUCATION 15MINUTES      Darrol Jump  07/12/2022, 11:42

## 2022-07-16 ENCOUNTER — Other Ambulatory Visit: Payer: Self-pay

## 2022-07-16 ENCOUNTER — Ambulatory Visit (HOSPITAL_COMMUNITY)
Admission: RE | Admit: 2022-07-16 | Discharge: 2022-07-16 | Disposition: A | Discharge status: Still In Hospital | Payer: Medicare Other | Source: Ambulatory Visit

## 2022-07-16 NOTE — PT Treatment (Addendum)
[facility]  Outpatient Physical Therapy  [place], [address]  [phone]  [phone]    Physical Therapy Treatment Note    Date: 07/16/2022  Patient's Name: Veronica Willis  Date of Birth: [birth year]    Visit #/POC:13/20  Authorization:No  POC Signed?: No  POC Ends: 08/16/2022  Next Progress Note Due: 07/25/2022     Evaluating Physical Therapist: Darrol Jump, MSPT  PT diagnosis/Reason for Referral: L hip pain M25.552; Gait abnormality (post hip replacement about 1 year ago)   Next Scheduled Physician Appointment: 08/09/2022  Allergies/Contraindications:  Penicillins and Sulfa;      Subjective: Pt reports that therapy is helping her.  Pt is now walking without her cane all the time.       Patient-Specific Functional Score:  Problem Score at Evaluation  06/25/2022   1. Walking  - Unable to rate  5   2. To get up and down out of the floor by herself 0  5   3. Walk up and down steps 5 (would like to be able to do it without the use of her hands 5   Average 1.66  5      Objective:      AROM                At evaluation  07/12/2022     right left  Right Left   Hip Flexion 110 109  110  110      Strength                  At evaluation  06/25/2022  07/12/2022     right left  bilateral Right left   Hip flexion  3+/5 3+/5  4/5 5/5 4/5   Hip extension  3+/5 3+/5  4/5 NT NT   Hip abduction 3+/5 3+/5  4/5 NT NT   Hip adduction 3+/5 3+/5  4/5 NT NT   Knee flexion  5/5 4/5  4/5 NT NT   Knee extension  5/5 5/5  5/5 NT NT   Ankle PF  Double leg heel raises x 10 (poor balance)   NT Double leg raise 2 x 10 with min A for balance NT      Gait:   At evaluation  06/06/2022  06/25/2022 07/12/2022   USES ASSISTIVE DEVICE, RIGHT FOOT DOES NOT PASS LEFT FOOT, LEFT FOOT DOES NOT PASS RIGHT FOOT, WIDE BOS, INITIATION OF GAIT WITH HESITATION, and UNSTEADY and limps slightly on the L leg  using cane to and from therapy; continues to use small steps with feet in ER, initiation of gait  with hesitation.  Still slightly unsteady.   ambulates without a cane; still uses small step lengths and is unsteady but has improved significantly; mild initiation of gait with hesitation Amb without a cane; improved step length and steadiness; no hesitation with initiation of gait      Balance:  BERG BALANCE TEST  BERG Total score at Evaluation  06/25/2022     31/56 - medium fall risk  45/56         Activities/interventions completed in this session are listed below:      EXERCISE/ACTIVITY NAME REPETITIONS RESISTANCE COMPLETED THIS DOS   NuStep  6 min at beginning of session for leg press strengthening - with therapist supervision and instruction L5 (legs only)  no   Walking in parallel bars  - forwards  - sideways  - backwards  Yes - Each   2 x 4 length(one with 1# weights and one without weights)      0-1# no   Step Ups    X 20 each side 6 inch with UE support no   Step downs    X 20 each side 6 inch with UE support  no   Lateral step overs X 10 each side  6 inch with UE support no   Standing 3-way SLR while on airex mat     - hip marches     - hip extension (hamstring curls)   - hip abd  - heel raises     - toe raises  - squats          - yes, 2 x 10 (with alternating arm flexion 1#)   - yes  x 10       - yes 2 x 10  - yes 2 x 10 with overhead press   - yes x 20   - yes x 10 Today 2# on legs, 1# in hands; no rails - with therapist providing min A        yes   Static stance on airex no UE support    45 sec x 3   no    Amb in clinic   - forwards  -sideways  -backwards       - 2 laps in clinic PT department independently  - sideways with SPV 50 ft x 2 each  - 50 ft x 2 ft backwards with SPV   no       standing in tandem   needs assist to get into the position   15 sec x 1 each side    no    quick stair tap alternating feet      x 15 holding both hands, x 15 holding on R and, x 15 holding on L     no       walking over obstacles  in parallel bars holding weights in hands  - forwards  -sideways       X4 CGA  X4  CGA       - yes  - yes   Walk on balance beam      X4 CGA   biodex  5-8 min   yes   Walking against tubing - forwards  - backwards  - sideways Grey tubing  - 100 ft  - 50 ft  - 50 ft       HEP:  Exercise Code Date given Continue   Standing Hip Abduction with Counter Support  [id number] 05/31/2022 yes   Standing Hip Extension with Counter Support       Standing March with Counter Support        Mini Squat with Counter Support                    Assessment:  Pt tolerated treatment well.  Pt continues to benefit from skilled outpatient physical therapy.      Short Term Goals: 5 Weeks               - Pt will be able to walk independently in parallel bars without using hands on rails forwards, backwards, and sideways x 3 each direction. GOAL MET, 06/25/2022               - Pt will improve her LE strength to 4/5 in all muscle groups tested GOAL MET, 06/25/2022               -  Pt will improve her BERG balance score to at least 36 or higher. GOAL MET, 06/25/2022               - Pt will improve her PSFS average score to 5.  GOAL MET, 06/25/2022     Long Term Goals: 10Weeks  - Pt will be able to walk independently forwards in the hallway for 4 lengths without LOB noted on 3 consecutive sessions. PROGRESSING, 06/25/2022               - Pt will improve her LE strength to 4+/5 in all muscle groups tested PROGRESSING, 06/25/2022               - Pt will improve her BERG balance score to at least 42 or higher. GOAL MET, 06/25/2022               - Pt will improve her PSFS average score to 7. PROGRESSING, 06/25/2022     Plan: Continue with POC.     Total Session Time 45 and Timed code minutes 45  THERAPEUTIC EXERCISE 15 minutes and NEURO RE-EDUCATION 30MINUTES      Darrol Jump  07/16/2022, 11:45

## 2022-07-19 ENCOUNTER — Other Ambulatory Visit: Payer: Self-pay

## 2022-07-19 ENCOUNTER — Ambulatory Visit (HOSPITAL_COMMUNITY)
Admission: RE | Admit: 2022-07-19 | Discharge: 2022-07-19 | Disposition: A | Discharge status: Still In Hospital | Payer: Medicare Other | Source: Ambulatory Visit

## 2022-07-19 NOTE — PT Treatment (Signed)
[facility]  Outpatient Physical Therapy  [place], [address]  [phone]  [phone]    Physical Therapy Treatment Note    Date: 07/19/2022  Patient's Name: Veronica Willis  Date of Birth: [birth year]    Visit #/POC:14/20  Authorization:No  POC Signed?: No  POC Ends: 08/16/2022  Next Progress Note Due: 07/25/2022     Evaluating Physical Therapist: Darrol Jump, MSPT  PT diagnosis/Reason for Referral: L hip pain M25.552; Gait abnormality (post hip replacement about 1 year ago)   Next Scheduled Physician Appointment: 08/09/2022  Allergies/Contraindications:  Penicillins and Sulfa;      Subjective: Pt is not feeling well today.  She has a toothache that is really bothering her today.  We did 30 min today since she did not feel well.       Patient-Specific Functional Score:  Problem Score at Evaluation  06/25/2022   1. Walking  - Unable to rate  5   2. To get up and down out of the floor by herself 0  5   3. Walk up and down steps 5 (would like to be able to do it without the use of her hands 5   Average 1.66  5      Objective:      AROM                At evaluation  07/12/2022     right left  Right Left   Hip Flexion 110 109  110  110      Strength                  At evaluation  06/25/2022  07/12/2022     right left  bilateral Right left   Hip flexion  3+/5 3+/5  4/5 5/5 4/5   Hip extension  3+/5 3+/5  4/5 NT NT   Hip abduction 3+/5 3+/5  4/5 NT NT   Hip adduction 3+/5 3+/5  4/5 NT NT   Knee flexion  5/5 4/5  4/5 NT NT   Knee extension  5/5 5/5  5/5 NT NT   Ankle PF  Double leg heel raises x 10 (poor balance)   NT Double leg raise 2 x 10 with min A for balance NT      Gait:   At evaluation  06/06/2022  06/25/2022 07/12/2022   USES ASSISTIVE DEVICE, RIGHT FOOT DOES NOT PASS LEFT FOOT, LEFT FOOT DOES NOT PASS RIGHT FOOT, WIDE BOS, INITIATION OF GAIT WITH HESITATION, and UNSTEADY and limps slightly on the L leg  using cane to and from therapy; continues to use  small steps with feet in ER, initiation of gait with hesitation.  Still slightly unsteady.   ambulates without a cane; still uses small step lengths and is unsteady but has improved significantly; mild initiation of gait with hesitation Amb without a cane; improved step length and steadiness; no hesitation with initiation of gait      Balance:  BERG BALANCE TEST  BERG Total score at Evaluation  06/25/2022     31/56 - medium fall risk  45/56         Activities/interventions completed in this session are listed below:      EXERCISE/ACTIVITY NAME REPETITIONS RESISTANCE COMPLETED THIS DOS   NuStep  6 min at beginning of session for leg press strengthening - with therapist supervision and instruction L5 (legs only)  no   Walking in parallel  bars  - forwards  - sideways  - backwards    Yes - Each   2 x 4 length(one with 1# weights and one without weights)      0-1# no   Step Ups    X 20 each side 6 inch with UE support no   Step downs    X 20 each side 6 inch with UE support  no   Lateral step overs X 10 each side  6 inch with UE support no   Standing 3-way SLR while on airex mat     - hip marches     - hip extension (hamstring curls)   - hip abd  - heel raises     - toe raises  - squats          - yes, 2 x 10 (with alternating arm flexion 1#)   - yes  x 10        - yes 2 x 10  - yes 2 x 10 with overhead press   - yes x 20   - yes x 10 Today 2# on legs, no weight in arms (pt not feeling well today due to tooth ache so we did not work as hard and PT let her hold onto the rail for ex)        yes   Static stance on airex no UE support    45 sec x 3   no    Amb in clinic   - forwards  -sideways  -backwards       - 2 laps in clinic PT department independently  - sideways with SPV 50 ft x 2 each  - 50 ft x 2 ft backwards with SPV   no       standing in tandem   needs assist to get into the position   15 sec x 1 each side    no    quick stair tap alternating feet      x 15 holding both hands, x 15 holding on R and, x 15 holding  on L     no       walking over obstacles  in parallel bars holding weights in hands  - forwards  -sideways       X4 CGA  X4 CGA       - yes  - yes   Walk on balance beam      X4 CGA   biodex  5-8 min   no   Walking against tubing - forwards  - backwards  - sideways Grey tubing  no   Walking - forwards 50 ft x 4 independently  - sideways 50 ft each independently  - backwards 50 ft x 2 independently   yes      HEP:  Exercise Code Date given Continue   Standing Hip Abduction with Counter Support  [id number] 05/31/2022 yes   Standing Hip Extension with Counter Support       Standing March with Counter Support        Mini Squat with Counter Support                    Assessment:  Pt tolerated treatment well.  Pt continues to benefit from skilled outpatient physical therapy.      Short Term Goals: 5 Weeks               - Pt will be able to walk independently  in parallel bars without using hands on rails forwards, backwards, and sideways x 3 each direction. GOAL MET, 06/25/2022               - Pt will improve her LE strength to 4/5 in all muscle groups tested GOAL MET, 06/25/2022               - Pt will improve her BERG balance score to at least 36 or higher. GOAL MET, 06/25/2022               - Pt will improve her PSFS average score to 5.  GOAL MET, 06/25/2022     Long Term Goals: 10Weeks  - Pt will be able to walk independently forwards in the hallway for 4 lengths without LOB noted on 3 consecutive sessions. GOAL MET, 07/19/2022               - Pt will improve her LE strength to 4+/5 in all muscle groups tested PROGRESSING, 06/25/2022               - Pt will improve her BERG balance score to at least 42 or higher. GOAL MET, 06/25/2022               - Pt will improve her PSFS average score to 7. PROGRESSING, 06/25/2022     Plan: Continue with POC.       Total Session Time 30 and Timed code minutes 30  THERAPEUTIC EXERCISE 15 minutes and NEURO RE-EDUCATION 15MINUTES      Darrol Jump  07/19/2022, 13:48

## 2022-07-25 ENCOUNTER — Ambulatory Visit (HOSPITAL_COMMUNITY): Payer: Self-pay

## 2022-07-26 ENCOUNTER — Ambulatory Visit
Admission: RE | Admit: 2022-07-26 | Discharge: 2022-07-26 | Disposition: A | Discharge status: Still In Hospital | Payer: Medicare Other | Source: Ambulatory Visit | Attending: Family Medicine | Admitting: Family Medicine

## 2022-07-26 ENCOUNTER — Other Ambulatory Visit: Payer: Self-pay

## 2022-07-26 NOTE — PT Treatment (Signed)
[facility]  Outpatient Physical Therapy  [place], [address]  [phone]  [phone]    Physical Therapy Treatment Note  PROGRESS NOTE    Date: 07/26/2022  Patient's Name: Veronica Willis  Date of Birth: 1940-09-23    Visit #/POC:20  Authorization:No  POC Signed?: No  POC Ends: 08/16/2022  Next Progress Note Due: 08/25/2021     Evaluating Physical Therapist: Darrol Jump, MSPT  PT diagnosis/Reason for Referral: L hip pain M25.552; Gait abnormality (post hip replacement about 1 year ago)   Next Scheduled Physician Appointment: 08/09/2022  Allergies/Contraindications:  Penicillins and Sulfa;      Subjective: Pt forgot her appointment was yesterday and was out of town for Christmas.  She thought her appointment was for today.  She came today and we were able to work her in for today.  Pt feels she is close to 80-100% better.      Patient-Specific Functional Score:  Problem Score at Evaluation  06/25/2022 07/26/2022   1. Walking  - Unable to rate  5 10   2. To get up and down out of the floor by herself 0  5 8   3. Walk up and down steps 5 (would like to be able to do it without the use of her hands 5 10   Average 1.66  5 9.33      Objective:      AROM                  At evaluation  07/12/2022 07/26/2022     right left  Right Left Right Left   Hip Flexion 110 109  110  110 113 105      Strength                    At evaluation  06/25/2022  07/12/2022 07/26/2022     right left  bilateral Right left Right Left   Hip flexion  3+/5 3+/5  4/5 5/5 4/5 5/5 5/5   Hip extension  3+/5 3+/5  4/5 NT NT 5/5 4+/5   Hip abduction 3+/5 3+/5  4/5 NT NT 5/5 5/5   Hip adduction 3+/5 3+/5  4/5 NT NT 5/5 5/5   Knee flexion  5/5 4/5  4/5 NT NT 5/5 5/5   Knee extension  5/5 5/5  5/5 NT NT 5/5 5/5   Ankle PF  Double leg heel raises x 10 (poor balance)   NT Double leg raise 2 x 10 with min A for balance NT  2 x 15 in DLS with light touch for balance      Gait:   At evaluation   06/06/2022  06/25/2022 07/12/2022 07/26/2022   USES ASSISTIVE DEVICE, RIGHT FOOT DOES NOT PASS LEFT FOOT, LEFT FOOT DOES NOT PASS RIGHT FOOT, WIDE BOS, INITIATION OF GAIT WITH HESITATION, and UNSTEADY and limps slightly on the L leg  using cane to and from therapy; continues to use small steps with feet in ER, initiation of gait with hesitation.  Still slightly unsteady.   ambulates without a cane; still uses small step lengths and is unsteady but has improved significantly; mild initiation of gait with hesitation Amb without a cane; improved step length and steadiness; no hesitation with initiation of gait Amb independently without a device; steady forwards, sideways, and backwards in clinic      [place]  SCORE (0-4)   SIT TO STAND    4   STANDING UNSUPPORTED    4   SITTING UNSUPPORTED    4   STANDING TO SITTNG    4   TRANSFERS    4   STANDING WITH EYES CLOSED    4   STANDING WITH FEET TOGETHER    3   REACHING FORWARD WITH OUTSTRETCHED ARMS   3   RETRIEVING OBJECT FROM GROUND    4   TURNING TO LOOK BEHIND    4   TURNING 360 DEGREES    4   PLACING ALTERNATE FOOT ON STOOL    4   STANDING WITH ONE FOOT IN FRONT    0   STANDING ON ONE FOOT    2                                                                        TOTAL 48       Interpretation:    0-20:   Wheelchair bound    21-40: Walking with assistance    41-56: Independent     BERG BALANCE TEST  BERG Total score at Evaluation  06/25/2022 07/26/2022     31/56 - medium fall risk  45/56 48/56         Activities/interventions completed in this session are listed below:      EXERCISE/ACTIVITY NAME REPETITIONS RESISTANCE COMPLETED THIS DOS   NuStep  5 min at beginning of session for leg press strengthening - with therapist supervision and instruction L6 (arms and legs yes   Walking in parallel bars  - forwards  - sideways  - backwards    Yes - Each   2 x 4 length(one with 1# weights and one without weights)      0-1# no   Step  Ups    X 20 each side 6 inch with UE support no   Step downs    X 20 each side 6 inch with UE support  no   Lateral step overs X 10 each side  6 inch with UE support no   Standing 3-way SLR while on airex mat     - hip marches     - hip extension (hamstring curls)   - hip abd  - heel raises     - toe raises  - squats          - yes, 2 x 10 (with alternating arm flexion 1#)   - yes  x 10        - yes 2 x 10  - yes 2 x 10 with overhead press   - yes x 20   - yes x 10 Today 2# on legs, no weight in arms (pt not feeling well today due to tooth ache so we did not work as hard and PT let her hold onto the rail for ex)        no   Static stance on airex no UE support    45 sec x 3   no    Amb in clinic   - forwards  -sideways  -backwards       - 2 laps in clinic PT department  independently  - sideways with SPV 50 ft x 2 each  - 50 ft x 2 ft backwards with SPV   no       standing in tandem   needs assist to get into the position   15 sec x 1 each side    no    quick stair tap alternating feet      x 15 holding both hands, x 15 holding on R and, x 15 holding on L     no       walking over obstacles  in parallel bars holding weights in hands  - forwards  -sideways       X4 CGA  X4 CGA       - no  - no   Walk on balance beam      no   biodex  5-8 min   no   Walking against tubing - forwards  - backwards  - sideways Grey tubing  no   Walking - forwards 50 ft x 4 independently  - sideways 50 ft each independently  - backwards 50 ft x 2 independently    yes   Tandem stance (pt wants to work on this for a goal)   No (next visit)   Stand on one foot (pt wants to work on this for a goal)    No (next visit)   Standing trunk extension - Wand shoulder flexion in standing  - Serratus roll  No (next visit)       HEP:  Exercise Code Date given Continue   Standing Hip Abduction with Counter Support  [id number] 05/31/2022 yes   Standing Hip Extension with Counter Support       Standing March with Counter Support        Mini Squat with Counter  Support                    Assessment:  Pt tolerated treatment well.  Pt continues to benefit from skilled outpatient physical therapy. PROGRESS NOTE - Pt has attended 20 visits.  Pt has made excellent progress.  Pt has met 4/4 STGs and 4/4 LTGs set at the initial evaluation.  Set 2 new goals today for balance.  Pt reports 80-100 % improvement since initial evaluation.  Pt can now functionally walk without fear of falling but would still like to be able to functionally stand on 1 foot for a few seconds and stand in tandem for a few seconds.  Pt continues to demonstrate deficits in balance that is preventing pt from reaching functional goals.  Pt would continue to benefit from skilled outpatient physical therapy services.        Short Term Goals: 5 Weeks               - Pt will be able to walk independently in parallel bars without using hands on rails forwards, backwards, and sideways x 3 each direction. GOAL MET, 06/25/2022               - Pt will improve her LE strength to 4/5 in all muscle groups tested GOAL MET, 06/25/2022               - Pt will improve her BERG balance score to at least 36 or higher. GOAL MET, 06/25/2022               - Pt will improve her PSFS average score to 5.  GOAL MET, 06/25/2022  Long Term Goals: 10Weeks  - Pt will be able to walk independently forwards in the hallway for 4 lengths without LOB noted on 3 consecutive sessions. GOAL MET, 07/19/2022               - Pt will improve her LE strength to 4+/5 in all muscle groups tested GOAL MET, 06/25/2022               - Pt will improve her BERG balance score to at least 42 or higher. GOAL MET, 06/25/2022               - Pt will improve her PSFS average score to 7. GOAL MET, 06/25/2022    New GOALS as of 07/26/2022   - Pt will be able to put 1 foot in front of the other in a tandem position on either side and maintain the position with either foot for 3 sec.   - Pt will be able to stand on 1 foot for 3 sec      Plan: Continue with POC for  4 more visits.  Discharged planned for the week of 08/09/2022.  Pt is in agreement with this plan.        Total Session Time 45 and Timed code minutes 45  THERAPEUTIC EXERCISE 30 minutes and NEURO RE-EDUCATION 15MINUTES      Darrol Jump  07/26/2022, 15:00

## 2022-08-01 ENCOUNTER — Other Ambulatory Visit: Payer: Self-pay

## 2022-08-01 ENCOUNTER — Ambulatory Visit (HOSPITAL_COMMUNITY): Payer: Self-pay

## 2022-08-01 ENCOUNTER — Ambulatory Visit (HOSPITAL_COMMUNITY)
Admission: RE | Admit: 2022-08-01 | Discharge: 2022-08-01 | Disposition: A | Discharge status: Still In Hospital | Payer: Medicare Other | Source: Ambulatory Visit

## 2022-08-01 DIAGNOSIS — M25552 Pain in left hip: Secondary | ICD-10-CM | POA: Insufficient documentation

## 2022-08-01 NOTE — PT Treatment (Signed)
[facility]  Outpatient Physical Therapy  [place], [address]  [phone]  [phone]    Physical Therapy Treatment Note    Date: 08/01/2022  Patient's Name: Veronica Willis  Date of Birth: [birth year]    Visit #/POC:21  Authorization:No  POC Signed?: No  POC Ends: 08/16/2022  Next Progress Note Due: 08/25/2021     Evaluating Physical Therapist: Darrol Jump, MSPT  PT diagnosis/Reason for Referral: L hip pain M25.552; Gait abnormality (post hip replacement about 1 year ago)   Next Scheduled Physician Appointment: 08/09/2022  Allergies/Contraindications:  Penicillins and Sulfa;      Subjective: No subjective data to report.     Objective: Activities/interventions completed in this session are listed below:      EXERCISE/ACTIVITY NAME REPETITIONS RESISTANCE COMPLETED THIS DOS   NuStep  5 min at beginning of session for leg press strengthening - with therapist supervision and instruction L5 (arms and legs yes   Walking in parallel bars  - forwards  - sideways  - backwards    Yes - Each   2 x 4 length(one with 1# weights and one without weights)      0-1# no   Step Ups    X 20 each side 6 inch with UE support no   Step downs    X 20 each side 6 inch with UE support  no   Lateral step overs X 10 each side  6 inch with UE support no   Standing 3-way SLR while on airex mat     - hip marches     - hip extension (hamstring curls)   - hip abd  - heel raises     - toe raises  - squats          - yes, 2 x 10 (with alternating arm flexion 1#)   - yes  x 10        - yes 2 x 10  - yes 2 x 10 with overhead press   - yes x 20   - yes 2 x 10 Today 3# on legs, 1# on arms       yes   Static stance on airex no UE support    45 sec x 3   no    Amb in clinic   - forwards  -sideways  -backwards       - 2 laps in clinic PT department independently  - sideways with SPV 50 ft x 2 each  - 50 ft x 2 ft backwards with SPV   no       standing in tandem   needs assist to get into the position    15 sec x 1 each side    no    quick stair tap alternating feet      x 15 holding both hands, x 15 holding on R and, x 15 holding on L     no       walking over obstacles  in parallel bars holding weights in hands  - forwards  -sideways       X4 CGA  X4 CGA       - no  - no   Walk on balance beam      no   biodex  5-8 min   no   Walking against tubing - forwards  - backwards  - sideways Grey tubing  no   Walking -  forwards 50 ft x 4 independently  - sideways 50 ft each independently  - backwards 50 ft x 2 independently    yes   Tandem stance (pt wants to work on this for a goal)  with cones each side with min to mod UE assist with cones x 20   yes   Stand on one foot (pt wants to work on this for a goal)   with cones each side with min to mod UE assist with cones x 20   yes   Standing trunk extension Standing x 10   yes      HEP:  Exercise Code Date given Continue   Standing Hip Abduction with Counter Support  [id number] 05/31/2022 yes   Standing Hip Extension with Counter Support       Standing March with Counter Support        Mini Squat with Counter Support                    Assessment:  Pt tolerated treatment well.  Pt continues to benefit from skilled outpatient physical therapy.      Short Term Goals: 5 Weeks               - Pt will be able to walk independently in parallel bars without using hands on rails forwards, backwards, and sideways x 3 each direction. GOAL MET, 06/25/2022               - Pt will improve her LE strength to 4/5 in all muscle groups tested GOAL MET, 06/25/2022               - Pt will improve her BERG balance score to at least 36 or higher. GOAL MET, 06/25/2022               - Pt will improve her PSFS average score to 5.  GOAL MET, 06/25/2022     Long Term Goals: 10Weeks  - Pt will be able to walk independently forwards in the hallway for 4 lengths without LOB noted on 3 consecutive sessions. GOAL MET, 07/19/2022               - Pt will improve her LE strength to 4+/5 in all muscle groups  tested GOAL MET, 06/25/2022               - Pt will improve her BERG balance score to at least 42 or higher. GOAL MET, 06/25/2022               - Pt will improve her PSFS average score to 7. GOAL MET, 06/25/2022     New GOALS as of 07/26/2022               - Pt will be able to put 1 foot in front of the other in a tandem position on either side and maintain the position with either foot for 3 sec.               - Pt will be able to stand on 1 foot for 3 sec      Plan: Continue with POC for 2 more visits.  Discharged planned for the week of 08/09/2022.  Pt is in agreement with this plan.      Total Session Time 35 and Timed code minutes 35  THERAPEUTIC EXERCISE 15 minutes and NEURO RE-EDUCATION 20MINUTES      Darrol Jump  08/01/2022, 11:42

## 2022-08-06 ENCOUNTER — Other Ambulatory Visit: Payer: Self-pay

## 2022-08-06 ENCOUNTER — Ambulatory Visit
Admission: RE | Admit: 2022-08-06 | Discharge: 2022-08-06 | Disposition: A | Discharge status: Still In Hospital | Payer: Medicare Other | Source: Ambulatory Visit | Attending: Family Medicine | Admitting: Family Medicine

## 2022-08-06 NOTE — PT Treatment (Addendum)
[facility]  Outpatient Physical Therapy  [address]  [place], [address]  [phone]  (Fax) [phone]    Physical Therapy Treatment Note  DISCHARGE SUMMARY    Date: 08/06/2022  Patient's Name: Veronica Willis  Date of Birth: 07/25/1941    Visit #/POC:22  Authorization:No  POC Signed?: No  POC Ends: 08/16/2022  Next Progress Note Due: 08/25/2021     Evaluating Physical Therapist: Oliver Hum, MSPT  PT diagnosis/Reason for Referral: L hip pain M25.552; Gait abnormality (post hip replacement about 1 year ago)   Next Scheduled Physician Appointment: 08/09/2022  Allergies/Contraindications:  Penicillins and Sulfa;      Subjective: No subjective data to report.     Objective: Activities/interventions completed in this session are listed below:      EXERCISE/ACTIVITY NAME REPETITIONS RESISTANCE COMPLETED THIS DOS   NuStep  5 min at end of session for leg press strengthening - with therapist supervision and instruction L5  yes   Walking in parallel bars  - forwards  - sideways  - backwards    Yes - Each   2 x 4 length(one with 1# weights and one without weights)      0-1# no   Step Ups    X 20 each side 6 inch with UE support no   Step downs    X 20 each side 6 inch with UE support  no   Lateral step overs X 10 each side  6 inch with UE support no   Standing 3-way SLR while on airex mat     - hip marches     - hip extension (hamstring curls)   - hip abd  - heel raises     - toe raises  - squats          - yes, 2 x 10 (with alternating arm flexion 1#)   - yes  x 10        - yes 2 x 10  - yes 2 x 10 with overhead press   - yes x 20   - yes 2 x 10 Today 3# on legs, 1# on arms       no   Static stance on airex no UE support    45 sec x 3   no    Amb in clinic   - forwards  -sideways  -backwards       - 2 laps in clinic PT department independently  - sideways with SPV 50 ft x 2 each  - 50 ft x 2 ft backwards with SPV   no       standing in tandem   needs assist to get into the position    15 sec x 1 each side    no    quick stair tap alternating feet      x 15 holding both hands, x 15 holding on R and, x 15 holding on L     no       walking over obstacles  in parallel bars holding weights in hands  - forwards  -sideways       X4 CGA  X4 CGA       - no  - no   Walk on balance beam      no   biodex  5-8 min   no   Walking against tubing - forwards  - backwards  - sideways Grey tubing  no   Walking -  forwards 50 ft x 4 independently  - sideways 50 ft each independently  - backwards 50 ft x 2 independently    yes   Tandem stance (pt wants to work on this for a goal)  yes   yes   Stand on one foot (pt wants to work on this for a goal)   1-2 sec each side x 10   Yes; HEP   Standing trunk extension Standing x 10   no   Reviewed HEP [id number]    yes      HEP:  Exercise Code Date given Continue   Standing Hip Abduction with Counter Support  [id number] 05/31/2022 no   Standing Hip Extension with Counter Support       Standing March with Counter Support        Mini Squat with Counter Support        New HEP   [id number]    yes      Access Code: [email or web address]  URL: [email or web address]  Date: 08/06/2022  Prepared by: Oliver Hum    Exercises  - Single Leg Balance with Clock Reach  - 1 x daily - 7 x weekly  - Tandem Stance with Support  - 1 x daily - 7 x weekly  - Sit to Stand Without Arm Support  - 1 x daily - 7 x weekly  - Hip Hinge with Cone Pick-Up  - 1 x daily - 7 x weekly  - Single Leg Stance with Support  - 1 x daily - 7 x weekly  - Sidestepping  - 1 x daily - 7 x weekly  - Backwards Walking  - 1 x daily - 7 x weekly  - Standing shoulder flexion wall slides  - 1 x daily - 7 x weekly  - Standing Shoulder W at Wall  - 1 x daily - 7 x weekly    Assessment:  PROGRESS NOTE - Pt has attended 22 visits.  Pt has made good progress.  Pt has met all initial STGs and LTGs and 0/2 new LTGs (set on 12/28/203)  Pt is much steadier than at her initial evaluation and feels able to continue at home with a HEP.  Given new  HEP at this visit and reviewed during session.  Pt was recommended for a session on 08/09/2021.  Pt arrived too late to be seen for that session and pt/PT agreed she was able to continue with her current HEP.  Discontinue at this time.      Short Term Goals: 5 Weeks               - Pt will be able to walk independently in parallel bars without using hands on rails forwards, backwards, and sideways x 3 each direction. GOAL MET, 06/25/2022               - Pt will improve her LE strength to 4/5 in all muscle groups tested GOAL MET, 06/25/2022               - Pt will improve her BERG balance score to at least 36 or higher. GOAL MET, 06/25/2022               - Pt will improve her PSFS average score to 5.  GOAL MET, 06/25/2022     Long Term Goals: 10Weeks  - Pt will be able to walk independently forwards in the hallway for 4 lengths without LOB noted on 3 consecutive sessions. GOAL MET,  07/19/2022               - Pt will improve her LE strength to 4+/5 in all muscle groups tested GOAL MET, 06/25/2022               - Pt will improve her BERG balance score to at least 42 or higher. GOAL MET, 06/25/2022               - Pt will improve her PSFS average score to 7. GOAL MET, 06/25/2022     New GOALS as of 07/26/2022               - Pt will be able to put 1 foot in front of the other in a tandem position on either side and maintain the position with either foot for 3 sec.               - Pt will be able to stand on 1 foot for 3 sec      Plan: Continue with POC for 1 more visits.  Discharged planned for the week of 08/09/2022.  Pt is in agreement with this plan.     Total Session Time 30 and Timed code minutes 30  THERAPEUTIC EXERCISE 15 minutes and NEURO RE-EDUCATION 15MINUTES      Darrol Jump  08/06/2022, 11:30  Darrol Jump, 08/09/2022, 13:26

## 2022-08-09 ENCOUNTER — Ambulatory Visit (HOSPITAL_COMMUNITY): Payer: Self-pay

## 2022-08-21 ENCOUNTER — Other Ambulatory Visit: Payer: Medicare Other | Attending: Family Medicine

## 2022-08-21 ENCOUNTER — Ambulatory Visit (INDEPENDENT_AMBULATORY_CARE_PROVIDER_SITE_OTHER): Payer: Medicare Other | Admitting: Family Medicine

## 2022-08-21 ENCOUNTER — Encounter (INDEPENDENT_AMBULATORY_CARE_PROVIDER_SITE_OTHER): Payer: Self-pay | Admitting: Family Medicine

## 2022-08-21 ENCOUNTER — Other Ambulatory Visit: Payer: Self-pay

## 2022-08-21 VITALS — BP 119/78 | HR 78 | Temp 99.1°F | Resp 20 | Ht 63.0 in | Wt 129.0 lb

## 2022-08-21 DIAGNOSIS — E782 Mixed hyperlipidemia: Secondary | ICD-10-CM

## 2022-08-21 DIAGNOSIS — R000 Tachycardia, unspecified: Secondary | ICD-10-CM

## 2022-08-21 DIAGNOSIS — I10 Essential (primary) hypertension: Secondary | ICD-10-CM | POA: Insufficient documentation

## 2022-08-21 DIAGNOSIS — J449 Chronic obstructive pulmonary disease, unspecified: Secondary | ICD-10-CM

## 2022-08-21 LAB — CBC WITH DIFF
BASOPHIL #: 0 10*3/uL (ref 0.00–0.10)
BASOPHIL %: 1 % (ref 0–1)
EOSINOPHIL #: 0.1 10*3/uL (ref 0.00–0.50)
EOSINOPHIL %: 2 %
HCT: 41.8 % (ref 31.2–41.9)
HGB: 14.1 g/dL (ref 10.9–14.3)
LYMPHOCYTE #: 1.1 10*3/uL (ref 1.00–3.00)
LYMPHOCYTE %: 19 % (ref 16–44)
MCH: 32.5 pg (ref 24.7–32.8)
MCHC: 33.8 g/dL (ref 32.3–35.6)
MCV: 96 fL — ABNORMAL HIGH (ref 75.5–95.3)
MONOCYTE #: 0.4 10*3/uL (ref 0.30–1.00)
MONOCYTE %: 7 % (ref 5–13)
MPV: 7.8 fL — ABNORMAL LOW (ref 7.9–10.8)
NEUTROPHIL #: 4 10*3/uL (ref 1.85–7.80)
NEUTROPHIL %: 71 % (ref 43–77)
PLATELETS: 224 10*3/uL (ref 140–440)
RBC: 4.35 10*6/uL (ref 3.63–4.92)
RDW: 13.7 % (ref 12.3–17.7)
WBC: 5.7 10*3/uL (ref 3.8–11.8)

## 2022-08-21 LAB — URINALYSIS, MACROSCOPIC
BILIRUBIN: NEGATIVE mg/dL
BLOOD: NEGATIVE mg/dL
GLUCOSE: NEGATIVE mg/dL
KETONES: NEGATIVE mg/dL
LEUKOCYTES: 500 WBCs/uL — AB
NITRITE: NEGATIVE
PH: 6 (ref 5.0–9.0)
PROTEIN: 30 mg/dL — AB
SPECIFIC GRAVITY: 1.024 (ref 1.002–1.030)
UROBILINOGEN: NORMAL mg/dL

## 2022-08-21 LAB — BASIC METABOLIC PANEL
ANION GAP: 8 mmol/L (ref 4–13)
BUN/CREA RATIO: 22 (ref 6–22)
BUN: 19 mg/dL (ref 7–25)
CALCIUM: 9.7 mg/dL (ref 8.6–10.3)
CHLORIDE: 107 mmol/L (ref 98–107)
CO2 TOTAL: 25 mmol/L (ref 21–31)
CREATININE: 0.87 mg/dL (ref 0.60–1.30)
ESTIMATED GFR: 67 mL/min/{1.73_m2} (ref >59)
GLUCOSE: 99 mg/dL (ref 74–109)
OSMOLALITY, CALCULATED: 282 mOsm/kg (ref 270–290)
POTASSIUM: 4 mmol/L (ref 3.5–5.1)
SODIUM: 140 mmol/L (ref 136–145)

## 2022-08-21 LAB — HEPATIC FUNCTION PANEL
ALBUMIN/GLOBULIN RATIO: 1.3 (ref 0.8–1.4)
ALBUMIN: 4.3 g/dL (ref 3.5–5.7)
ALKALINE PHOSPHATASE: 59 U/L (ref 34–104)
ALT (SGPT): 13 U/L (ref 7–52)
AST (SGOT): 23 U/L (ref 13–39)
BILIRUBIN DIRECT: 0.21 md/dL — ABNORMAL HIGH (ref <=0.20)
BILIRUBIN TOTAL: 0.8 mg/dL (ref 0.3–1.2)
BILIRUBIN, INDIRECT: 0.59 mg/dL (ref <=1)
GLOBULIN: 3.2 (ref 2.9–5.4)
PROTEIN TOTAL: 7.5 g/dL (ref 6.4–8.9)

## 2022-08-21 LAB — URINALYSIS, MICROSCOPIC
RBCS: 27 /hpf — ABNORMAL HIGH (ref <4)
SQUAMOUS EPITHELIAL: 3 /hpf (ref <28)
WBCS: 117 /hpf — ABNORMAL HIGH (ref <6)

## 2022-08-21 LAB — LIPID PANEL
CHOL/HDL RATIO: 1.7
CHOLESTEROL: 161 mg/dL (ref <200)
HDL CHOL: 94 mg/dL — ABNORMAL HIGH (ref 23–92)
LDL CALC: 52 mg/dL (ref 0–100)
TRIGLYCERIDES: 74 mg/dL (ref <=150)
VLDL CALC: 15 mg/dL (ref 0–50)

## 2022-08-21 LAB — THYROID STIMULATING HORMONE WITH FREE T4 REFLEX: TSH: 1.473 u[IU]/mL (ref 0.450–5.330)

## 2022-08-21 MED ORDER — BUSPIRONE 5 MG TABLET
5.0000 mg | ORAL_TABLET | Freq: Three times a day (TID) | ORAL | 2 refills | Status: DC
Start: 2022-08-21 — End: 2022-11-30

## 2022-08-21 MED ORDER — METOPROLOL SUCCINATE ER 25 MG TABLET,EXTENDED RELEASE 24 HR
25.0000 mg | ORAL_TABLET | Freq: Two times a day (BID) | ORAL | 2 refills | Status: DC
Start: 2022-08-21 — End: 2023-05-22
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
## 2022-08-21 NOTE — Nursing Note (Signed)
08/21/22 1117   Fall Risk Assessment   Do you feel unsteady when standing or walking? Yes   Do you worry about falling? Yes   Have you fallen in the past year? No

## 2022-08-21 NOTE — Progress Notes (Signed)
[facility]  FAMILY MEDICINE, MEDICAL OFFICE BUILDING  [place] [address]  [place]  04-Dec-1940  [id number]    Date of Service: 08/21/2022    Chief complaint:   Chief Complaint   Patient presents with    Follow Up 3 Months       Subjective:     Veronica Willis is a 82 y.o. female and reports no acute complaints.    Past Medical History:  Past Medical History:   Diagnosis Date    Anxiety     Cardiac arrhythmia     Chronic obstructive airway disease (CMS HCC)     Hypercholesterolemia     Hypertension     Hypomagnesemia     Intertrochanteric fracture of left femur (CMS HCC)     Vitamin D deficiency          Past Surgical History:   Procedure Laterality Date    BRONCHOSCOPY      HX CARPAL TUNNEL RELEASE Bilateral     HX CYSTOCELE REPAIR      HX HIP REPLACEMENT Left     HX HIP REPLACEMENT Left             Medications:  Current Outpatient Medications   Medication Sig Dispense Refill    albuterol sulfate (PROVENTIL) 2.5 mg /3 mL (0.083 %) Inhalation nebulizer solution Take 0.5 mL (2.5 mg total) by nebulization Three times a day as needed for Wheezing for up to 180 days 270 mL 0    busPIRone (BUSPAR) 5 mg Oral Tablet Take 1 Tablet (5 mg total) by mouth Three times a day Indications: repeated episodes of anxiety 90 Tablet 2    ergocalciferol, vitamin D2, (DRISDOL) 1,250 mcg (50,000 unit) Oral Capsule Take 1 Capsule (50,000 Units total) by mouth Every 7 days      fluticasone furoate-vilanteroL (BREO ELLIPTA) 100-25 mcg/dose Inhalation Disk with Device Take 1 INHALATION  by inhalation Once a day for 180 days 180 Each 1    guaiFENesin 100 mg/5 mL Oral Liquid Take 20 mL (400 mg total) by mouth Every 4 hours as needed      ipratropium-albuterol 0.5 mg-3 mg(2.5 mg base)/3 mL Solution for Nebulization Take 3 mL by nebulization Every 4 hours as needed for up to 180 days 270 mL 1    loratadine (CLARITIN) 10 mg Oral Tablet Take 1 Tablet (10 mg total) by mouth Once a day       losartan (COZAAR) 100 mg Oral Tablet Take 1 Tablet (100 mg total) by mouth Once a day for 180 days 90 Tablet 1    MAGNESIUM GLYCINATE ORAL Take by mouth OTC. Patient unsure of formulary or dosing. Indications: fast heartbeat      metoprolol succinate (TOPROL-XL) 25 mg Oral Tablet Sustained Release 24 hr Take 1 Tablet (25 mg total) by mouth Once a day      sertraline (ZOLOFT) 100 mg Oral Tablet Take 1 Tablet (100 mg total) by mouth Once a day for 180 days 90 Tablet 1    simvastatin (ZOCOR) 40 mg Oral Tablet Take 1 Tablet (40 mg total) by mouth Every evening for 180 days 90 Tablet 1     No current facility-administered medications for this visit.     Allergies   Allergen Reactions    Penicillins Nausea/ Vomiting    Sulfa (Sulfonamides) Nausea/ Vomiting     Review of Systems  Review of Systems   Constitutional:  Negative for activity change, appetite change, fatigue and fever.   HENT:  Negative for congestion, ear pain, sinus pressure and sinus pain.    Respiratory:  Negative for cough, chest tightness and shortness of breath.    Cardiovascular:  Negative for chest pain.   Gastrointestinal:  Negative for diarrhea, nausea and vomiting.   Genitourinary:  Negative for difficulty urinating and flank pain.   Musculoskeletal:  Negative for arthralgias and myalgias.   Skin:  Negative for color change, pallor, rash and wound.   Neurological:  Negative for dizziness, weakness and headaches.   Psychiatric/Behavioral:  Negative for agitation, confusion and decreased concentration. The patient is not nervous/anxious and is not hyperactive.       Objective:     BP 119/78 (Site: Left, Patient Position: Sitting, Cuff Size: Adult)   Pulse 78   Temp 37.3 C (99.1 F) (Temporal)   Resp 20   Ht 1.6 m (5\' 3" )   Wt 58.5 kg (129 lb)   SpO2 95%   BMI 22.85 kg/m        Physical Exam  Constitutional:       General: She is not in acute distress.     Appearance: Normal appearance. She is normal weight.   HENT:      Right Ear: Tympanic  membrane, ear canal and external ear normal.      Left Ear: Tympanic membrane, ear canal and external ear normal.      Nose: No congestion.      Mouth/Throat:      Mouth: Mucous membranes are moist.   Eyes:      Pupils: Pupils are equal, round, and reactive to light.   Cardiovascular:      Rate and Rhythm: Normal rate and regular rhythm.      Pulses: Normal pulses.      Heart sounds: Normal heart sounds.   Pulmonary:      Effort: Pulmonary effort is normal.      Breath sounds: Normal breath sounds.   Abdominal:      General: Abdomen is flat.      Palpations: Abdomen is soft.   Musculoskeletal:         General: Normal range of motion.   Skin:     General: Skin is warm and dry.      Capillary Refill: Capillary refill takes less than 2 seconds.   Neurological:      General: No focal deficit present.      Mental Status: She is alert and oriented to person, place, and time. Mental status is at baseline.   Psychiatric:         Mood and Affect: Mood normal.         Behavior: Behavior normal.       Laboratory:    Most recent lab results reviewed with patient.    Assessment:       ICD-10-CM    1. Tachycardia  R00.0 Referral to External Provider      2. Chronic obstructive pulmonary disease, unspecified COPD type (CMS HCC)  J44.9       3. Hyperlipidemia, mixed  E78.2 HEPATIC FUNCTION PANEL     LIPID PANEL     THYROID STIMULATING HORMONE WITH FREE T4 REFLEX      4. Essential hypertension  M54 BASIC METABOLIC PANEL     CBC/DIFF     URINALYSIS, MACROSCOPIC AND MICROSCOPIC W/CULTURE REFLEX        Plan:     Patient states  that she is feeling better since she has been going to physical therapy. Left hip pain is still bothering her early in the mornings, but pain is manageable with NSAIDs. HR and blood pressure are within normal limits today, but the patient endorses more episodes of symptomatic tachycardia with dizziness, blurred vision, and tremors. Patient states that she believes the episodes are precipitated by extreme anxiety  and was open to trying Buspar PRN for anxiety. Patient referred back to previously seen cardiologist. Metoprolol dosing changed to BID based on most recent, available cardiology recommendation.    Patient Counseling:  The patient was given the opportunity to ask questions and those questions were answered to the patient's satisfaction. The patient was encouraged to call with any additional questions or concerns. Instructed patient to call back with onset of new symptoms or if current symptoms persist or worsen.     Counseling with regards to preventative care given. Immunizations reviewed. Discussed with patient effects and side effects of medications. Medication safety was discussed. A good faith effort was made to reconcile the patient's medications.   Plan:  Orders Placed This Encounter    BASIC METABOLIC PANEL    CBC/DIFF    HEPATIC FUNCTION PANEL    LIPID PANEL    THYROID STIMULATING HORMONE WITH FREE T4 REFLEX    URINALYSIS, MACROSCOPIC AND MICROSCOPIC W/CULTURE REFLEX    CBC WITH DIFF    URINALYSIS, MACROSCOPIC    URINALYSIS, MICROSCOPIC    Referral to External Provider    busPIRone (BUSPAR) 5 mg Oral Tablet       Depression screening is negative. PHQ 2 Total: 0              Return in about 3 months (around 11/20/2022).  Ignacia Marvel, APRN,NP-C 08/21/2022, 12:18

## 2022-08-21 NOTE — Nursing Note (Signed)
08/21/22 1118   Depression Screen   Little interest or pleasure in doing things. 0   Feeling down, depressed, or hopeless 0   PHQ 2 Total 0

## 2022-08-22 ENCOUNTER — Other Ambulatory Visit (INDEPENDENT_AMBULATORY_CARE_PROVIDER_SITE_OTHER): Payer: Self-pay

## 2022-08-22 MED ORDER — NITROFURANTOIN MONOHYDRATE/MACROCRYSTALS 100 MG CAPSULE
100.0000 mg | ORAL_CAPSULE | Freq: Two times a day (BID) | ORAL | 0 refills | Status: DC
Start: 2022-08-22 — End: 2022-09-02

## 2022-08-23 ENCOUNTER — Other Ambulatory Visit (INDEPENDENT_AMBULATORY_CARE_PROVIDER_SITE_OTHER): Payer: Self-pay | Admitting: Family Medicine

## 2022-08-23 DIAGNOSIS — J449 Chronic obstructive pulmonary disease, unspecified: Secondary | ICD-10-CM

## 2022-08-23 LAB — URINE CULTURE,ROUTINE: URINE CULTURE: 5000 — AB

## 2022-09-02 ENCOUNTER — Other Ambulatory Visit: Payer: Self-pay

## 2022-09-02 ENCOUNTER — Emergency Department
Admission: EM | Admit: 2022-09-02 | Discharge: 2022-09-02 | Discharge status: Against Medical Advice | Payer: Medicare Other | Attending: Emergency Medicine | Admitting: Emergency Medicine

## 2022-09-02 ENCOUNTER — Encounter (HOSPITAL_COMMUNITY): Payer: Self-pay

## 2022-09-02 ENCOUNTER — Emergency Department (HOSPITAL_COMMUNITY): Payer: Medicare Other

## 2022-09-02 DIAGNOSIS — Z789 Other specified health status: Secondary | ICD-10-CM

## 2022-09-02 DIAGNOSIS — Z5329 Procedure and treatment not carried out because of patient's decision for other reasons: Secondary | ICD-10-CM

## 2022-09-02 DIAGNOSIS — F1090 Alcohol use, unspecified, uncomplicated: Secondary | ICD-10-CM | POA: Insufficient documentation

## 2022-09-02 DIAGNOSIS — R55 Syncope and collapse: Secondary | ICD-10-CM

## 2022-09-02 DIAGNOSIS — Z1152 Encounter for screening for COVID-19: Secondary | ICD-10-CM | POA: Insufficient documentation

## 2022-09-02 DIAGNOSIS — I959 Hypotension, unspecified: Secondary | ICD-10-CM

## 2022-09-02 DIAGNOSIS — Y902 Blood alcohol level of 40-59 mg/100 ml: Secondary | ICD-10-CM

## 2022-09-02 LAB — PTT (PARTIAL THROMBOPLASTIN TIME): APTT: 29.9 seconds (ref 25.0–38.0)

## 2022-09-02 LAB — URINALYSIS, MICROSCOPIC
RBCS: 2 /hpf (ref <4)
RENAL EPITHELIAL CELLS URINE: <1 /hpf (ref <6)
SQUAMOUS EPITHELIAL: 6 /hpf (ref <28)
TRANSITIONAL EPITHELIAL CELLS URINE: 1 /hpf (ref <6)
WBCS: 4 /hpf (ref <6)

## 2022-09-02 LAB — URINALYSIS, MACROSCOPIC
BILIRUBIN: NEGATIVE mg/dL
BLOOD: NEGATIVE mg/dL
GLUCOSE: NEGATIVE mg/dL
KETONES: NEGATIVE mg/dL
LEUKOCYTES: 250 WBCs/uL — AB
NITRITE: NEGATIVE
PH: 7 (ref 5.0–9.0)
PROTEIN: NEGATIVE mg/dL
SPECIFIC GRAVITY: 1.025 (ref 1.002–1.030)
UROBILINOGEN: NORMAL mg/dL

## 2022-09-02 LAB — COMPREHENSIVE METABOLIC PANEL, NON-FASTING
ALBUMIN/GLOBULIN RATIO: 1.2 (ref 0.8–1.4)
ALBUMIN: 3.7 g/dL (ref 3.5–5.7)
ALKALINE PHOSPHATASE: 59 U/L (ref 34–104)
ALT (SGPT): 13 U/L (ref 7–52)
ANION GAP: 10 mmol/L (ref 4–13)
AST (SGOT): 24 U/L (ref 13–39)
BILIRUBIN TOTAL: 0.7 mg/dL (ref 0.3–1.2)
BUN/CREA RATIO: 18 (ref 6–22)
BUN: 16 mg/dL (ref 7–25)
CALCIUM, CORRECTED: 9 mg/dL (ref 8.9–10.8)
CALCIUM: 8.8 mg/dL (ref 8.6–10.3)
CHLORIDE: 105 mmol/L (ref 98–107)
CO2 TOTAL: 22 mmol/L (ref 21–31)
CREATININE: 0.91 mg/dL (ref 0.60–1.30)
ESTIMATED GFR: 64 mL/min/{1.73_m2} (ref >59)
GLOBULIN: 3 (ref 2.9–5.4)
GLUCOSE: 104 mg/dL (ref 74–109)
OSMOLALITY, CALCULATED: 275 mOsm/kg (ref 270–290)
POTASSIUM: 4.1 mmol/L (ref 3.5–5.1)
PROTEIN TOTAL: 6.7 g/dL (ref 6.4–8.9)
SODIUM: 137 mmol/L (ref 136–145)

## 2022-09-02 LAB — LIPASE: LIPASE: 109 U/L — ABNORMAL HIGH (ref 11–82)

## 2022-09-02 LAB — ARTERIAL BLOOD GAS/LACTATE
%FIO2 (ARTERIAL): 21 %
BASE DEFICIT: 7.2 mmol/L — ABNORMAL HIGH (ref 0.0–2.0)
BICARBONATE (ARTERIAL): 19.4 mmol/L — ABNORMAL LOW (ref 20.0–26.0)
CARBOXYHEMOGLOBIN: 1.3 % (ref <=1.5)
LACTATE: 2.8 mmol/L (ref <=2.0)
MET-HEMOGLOBIN: 0.1 % (ref <=2.0)
O2CT: 17.1 %
OXYHEMOGLOBIN: 94.2 % (ref 88.0–100.0)
PCO2 (ARTERIAL): 31 mm/Hg — ABNORMAL LOW (ref 35–45)
PH (ARTERIAL): 7.38 (ref 7.35–7.45)
PO2 (ARTERIAL): 78 mm/Hg — ABNORMAL LOW (ref 80–100)

## 2022-09-02 LAB — CBC WITH DIFF
BASOPHIL #: 0 10*3/uL (ref 0.00–0.10)
BASOPHIL %: 1 % (ref 0–1)
EOSINOPHIL #: 0.1 10*3/uL (ref 0.00–0.50)
EOSINOPHIL %: 3 %
HCT: 39.3 % (ref 31.2–41.9)
HGB: 13.5 g/dL (ref 10.9–14.3)
LYMPHOCYTE #: 0.9 10*3/uL — ABNORMAL LOW (ref 1.00–3.00)
LYMPHOCYTE %: 19 % (ref 16–44)
MCH: 33.4 pg — ABNORMAL HIGH (ref 24.7–32.8)
MCHC: 34.4 g/dL (ref 32.3–35.6)
MCV: 96.9 fL — ABNORMAL HIGH (ref 75.5–95.3)
MONOCYTE #: 0.5 10*3/uL (ref 0.30–1.00)
MONOCYTE %: 10 % (ref 5–13)
MPV: 7.5 fL — ABNORMAL LOW (ref 7.9–10.8)
NEUTROPHIL #: 3.3 10*3/uL (ref 1.85–7.80)
NEUTROPHIL %: 68 % (ref 43–77)
PLATELETS: 192 10*3/uL (ref 140–440)
RBC: 4.06 10*6/uL (ref 3.63–4.92)
RDW: 14 % (ref 12.3–17.7)
WBC: 4.9 10*3/uL (ref 3.8–11.8)

## 2022-09-02 LAB — COVID-19, FLU A/B, RSV RAPID BY PCR
INFLUENZA VIRUS TYPE A: NOT DETECTED
INFLUENZA VIRUS TYPE B: NOT DETECTED
RESPIRATORY SYNCTIAL VIRUS (RSV): NOT DETECTED
SARS-CoV-2: NOT DETECTED

## 2022-09-02 LAB — PT/INR
INR: 1.17 — ABNORMAL HIGH (ref 0.84–1.10)
PROTHROMBIN TIME: 13.5 seconds — ABNORMAL HIGH (ref 9.8–12.7)

## 2022-09-02 LAB — LACTIC ACID LEVEL W/ REFLEX FOR LEVEL >2.0: LACTIC ACID: 3 mmol/L — ABNORMAL HIGH (ref 0.5–2.2)

## 2022-09-02 LAB — ETHANOL, SERUM/PLASMA: ETHANOL: 58 mg/dL — ABNORMAL HIGH

## 2022-09-02 MED ORDER — IOHEXOL 350 MG IODINE/ML INTRAVENOUS SOLUTION
50.0000 mL | INTRAVENOUS | Status: AC
Start: 2022-09-02 — End: 2022-09-02
  Administered 2022-09-02: 100 mL via INTRAVENOUS

## 2022-09-02 MED ORDER — SODIUM CHLORIDE 0.9 % IV BOLUS
30.0000 mL/kg | INJECTION | Status: AC
Start: 2022-09-02 — End: 2022-09-02
  Administered 2022-09-02: 0 mL via INTRAVENOUS
  Administered 2022-09-02: 1755 mL via INTRAVENOUS

## 2022-09-02 NOTE — ED Nurses Note (Signed)
Patient to ER 22 via EMS s/p syncopal episode at church this morning. Patient reports that she experienced shortness of breath and dizziness/lightheadedness prior to having the syncopal episode. Patient has a hx of a cardiac arrhythmia (daughter reports SVT). Patient denies any chest pain. Patient is awake, A&O x 4. NAD noted. Patient placed on beside monitor (cardiac, BP, and pulse ox). VSS. Call light within reach.

## 2022-09-02 NOTE — ED Nurses Note (Signed)
Patient wanting to leave AMA. Patient states that she is not staying in the hospital. Explained the risks/consequences of leaving AMA, and patient verbalized understanding. ER provider aware.

## 2022-09-02 NOTE — ED Nurses Note (Signed)
AMA formed signed at this time.

## 2022-09-02 NOTE — ED Nurses Note (Signed)
Patient discharged home with family.  AVS reviewed with patient.  A written copy of the AVS and discharge instructions was given to the patient.  Questions sufficiently answered as needed.  Patient encouraged to follow up with PCP as indicated.  In the event of an emergency, patient instructed to call 911 or go to the nearest emergency room.

## 2022-09-02 NOTE — ED Triage Notes (Signed)
SYNCOPAL EPISODE AT [place]. B/P 82/45 ON ARRIVAL. 4LEAD, GLUCOSE 130, 18G RIGHT FA WITH FLUIDS.

## 2022-09-02 NOTE — ED Provider Notes (Signed)
[facility]  ED Primary Provider Note  Patient Name: Veronica Willis  Patient Age: 82 y.o.  Date of Birth: 1940-09-04    Chief Complaint: Syncope        History of Present Illness       Veronica Willis is a 82 y.o. female who had concerns including Syncope.  This patient is a 82 year female who presents after multiple syncopal episodes at church.  She denies any alcohol use, and does not remember her syncopal episodes.  She also states she did not take her medication this morning.  No head injury noted.        Review of Systems     No other overt Review of Systems are noted to be positive except noted in the HPI.      Historical Data   History Reviewed This Encounter: Medical History  Surgical History  Family History  Social History      Physical Exam   ED Triage Vitals [09/02/22 1128]   BP (Non-Invasive) (!) 82/45   Heart Rate (!) 130   Respiratory Rate 18   Temperature 36.1 C (96.9 F)   SpO2 95 %   Weight 58.5 kg (128 lb 15.5 oz)   Height          Nursing notes reviewed for what could be assessed. Past Medical, Surgical, and Social history reviewed for what has been completed.    Constitutional: NAD. Well-Developed. Well Nourished.  Head: Normocephalic, atraumatic.  Mouth/Throat:  Symmetric facial movement.  Eyes: EOM grossly intact, conjunctiva normal.  Neck: Supple  Cardiovascular:  Tachycardic Rate and Rhythm, extremities well perfused.  Pulmonary/Chest: No respiratory distress. Lungs are symmetric to auscultation bilaterally.  Abdominal: Soft, non-tender, non-distended. Non peritoneal, no rebound, no guarding.  MSK: No Lower Extremity Edema.  Skin: Warm, dry, and intact  Neuro: Appropriate, CN II-XII grossly intact.   Psych: Pleasant              Procedures      Patient Data     Labs Ordered/Reviewed   ARTERIAL BLOOD GAS/LACTATE - Abnormal; Notable for the following components:       Result Value    PCO2 (ARTERIAL) 31 (*)     BICARBONATE (ARTERIAL) 19.4 (*)     LACTATE 2.8 (*)      PO2 (ARTERIAL) 78 (*)     BASE DEFICIT 7.2 (*)     All other components within normal limits    Narrative:     .  .  .  .  .  Marland Kitchen   ETHANOL, SERUM/PLASMA - Abnormal; Notable for the following components:    ETHANOL 58 (*)     All other components within normal limits   LIPASE - Abnormal; Notable for the following components:    LIPASE 109 (*)     All other components within normal limits   LACTIC ACID LEVEL W/ REFLEX FOR LEVEL >2.0 - Abnormal; Notable for the following components:    LACTIC ACID 3.0 (*)     All other components within normal limits   PT/INR - Abnormal; Notable for the following components:    PROTHROMBIN TIME 13.5 (*)     INR 1.17 (*)     All other components within normal limits    Narrative:     INR OF 2.0-3.0  RECOMMENDED FOR: PROPHYLAXIS/TREATMENT OF VENEOUS THROMBOSIS, PULMONARY EMBOLISM, PREVENTION OF SYSTEMIC EMBOLISM FROM ATRIAL FIBRILATION, MYOCARDIAL INFARCTION.    INR OF 2.5-3.5  RECOMMENDED FOR MECHANICAL PROSTHETIC HEART VALVES, RECURRENT SYSTEMIC EMBOLISM, RECURRENT MYOCARDIAL INFARCTION.     CBC WITH DIFF - Abnormal; Notable for the following components:    MCV 96.9 (*)     MCH 33.4 (*)     MPV 7.5 (*)     LYMPHOCYTE # 0.90 (*)     All other components within normal limits   COMPREHENSIVE METABOLIC PANEL, NON-FASTING - Normal    Narrative:     Estimated Glomerular Filtration Rate (eGFR) is calculated using the CKD-EPI (2021) equation, intended for patients 64 years of age and older. If gender is not documented or "unknown", there will be no eGFR calculation.     PTT (PARTIAL THROMBOPLASTIN TIME) - Normal   COVID-19, FLU A/B, RSV RAPID BY PCR - Normal    Narrative:     Results are for the simultaneous qualitative identification of SARS-CoV-2 (formerly 2019-nCoV), Influenza A, Influenza B, and RSV RNA. These etiologic agents are generally detectable in nasopharyngeal and nasal swabs during the ACUTE PHASE of infection. Hence, this test is intended to be performed on respiratory specimens  collected from individuals with signs and symptoms of upper respiratory tract infection who meet Centers for Disease Control and Prevention (CDC) clinical and/or epidemiological criteria for Coronavirus Disease 2019 (COVID-19) testing. CDC COVID-19 criteria for testing on human specimens is available at [facility] webpage information for Healthcare Professionals: Coronavirus Disease 2019 (COVID-19) ([email or web address]).     False-negative results may occur if the virus has genomic mutations, insertions, deletions, or rearrangements or if performed very early in the course of illness. Otherwise, negative results indicate virus specific RNA targets are not detected, however negative results do not preclude SARS-CoV-2 infection/COVID-19, Influenza, or Respiratory syncytial virus infection. Results should not be used as the sole basis for patient management decisions. Negative results must be combined with clinical observations, patient history, and epidemiological information. If upper respiratory tract infection is still suspected based on exposure history together with other clinical findings, re-testing should be considered.    Disclaimer:   This assay has been authorized by FDA under an Emergency Use Authorization for use in laboratories certified under the Clinical Laboratory Improvement Amendments of 1988 (CLIA), 42 U.S.C. [phone], to perform high complexity tests. The impacts of vaccines, antiviral therapeutics, antibiotics, chemotherapeutic or immunosuppressant drugs have not been evaluated.     Test methodology:   Cepheid Xpert Xpress SARS-CoV-2/Flu/RSV Assay real-time polymerase chain reaction (RT-PCR) test on the GeneXpert Dx and Xpert Xpress systems.   ADULT ROUTINE BLOOD CULTURE, SET OF 2 BOTTLES (BACTERIA AND YEAST)   ADULT ROUTINE BLOOD CULTURE, SET OF 2 BOTTLES (BACTERIA AND YEAST)   CBC/DIFF    Narrative:     The following orders were created for panel order  CBC/DIFF.  Procedure                               Abnormality         Status                     ---------                               -----------         ------                     CBC WITH NFAO[[id number]]  Abnormal            Final result                 Please view results for these tests on the individual orders.   URINALYSIS, MACROSCOPIC AND MICROSCOPIC W/CULTURE REFLEX    Narrative:     The following orders were created for panel order URINALYSIS, MACROSCOPIC AND MICROSCOPIC W/CULTURE REFLEX.  Procedure                               Abnormality         Status                     ---------                               -----------         ------                     URINALYSIS, MACROSCOPIC[[id number]]                          In process                 URINALYSIS, MICROSCOPIC[[id number]]                          In process                   Please view results for these tests on the individual orders.   URINALYSIS, MACROSCOPIC   URINALYSIS, MICROSCOPIC   LACTIC ACID - FIRST REFLEX   PERFORM POC WHOLE BLOOD GLUCOSE       CT ANGIO CHEST W IV CONTRAST   Final Result by Edi, Radresults In (02/04 1306)   NO ACUTE FINDINGS.         One or more dose reduction techniques were used (e.g., Automated exposure control, adjustment of the mA and/or kV according to patient size, use of iterative reconstruction technique).         Radiologist location ID: [id number]         CT BRAIN WO IV CONTRAST   Final Result by Edi, Radresults In (02/04 1300)   NO ACUTE FINDINGS         One or more dose reduction techniques were used (e.g., Automated exposure control, adjustment of the mA and/or kV according to patient size, use of iterative reconstruction technique).         Radiologist location ID: [id number]         XR AP MOBILE CHEST   Final Result by Edi, Radresults In (02/04 1212)   NO ACUTE FINDINGS.            Radiologist location ID: [place] Decision Making          Medical Decision  Making        Studies Assessed and/or Ordered:  Lab, EKG, radiology    EKG:   This EKG interpreted by me shows:    Rate:  72 beats per minute    Interpretation:  PR to 210, No consistent ST Elevation, No Acute STEMI Identified.      MDM Narrative:  This patient is a  82 year old female who presents after multiple syncopal episodes at church.  Patient initially denied alcohol use however did have alcohol in her system.  Upon questioning this a 2nd time, the patient endorse drinking a bottle of wine last night.  This likely contributed to dehydration, hypotension, multiple syncopal episodes.  Once the patient was given IV fluids, she was not hypotensive any further.  She is oriented to person, date, and situation.  She can even voiced the risk of death if she were to go home and fall.  Patient will be leaving against medical advice and did sign the AMA documentation.             Medications Administered in the ED   NS bolus infusion 30 mL/kg = 1,755 mL (0 mL Intravenous Stopped 09/02/22 1230)   iohexol (OMNIPAQUE 350) infusion (100 mL Intravenous Given 09/02/22 1237)       I recommended and offered further workup/admission to the patient for further evaluation and treatment.  Risk of worsening illness, long term disability, or even death as a result of declining further workup/admission discussed with and understood by patient.  Patient demonstrates adequate comprehension and decision making capacity.  Patient stated understanding of why, as the treating physician, I have recommended further workup/admission and the possible adverse outcomes of declining such at this time.  All questions were answered.  The patient is encouraged to return to the Emergency Department at any time, and for any reason, to be re-evaluated and treated further.    AMA             Clinical Impression   Hypotension, unspecified hypotension type (Primary)   Syncope, unspecified syncope type   Alcohol use         Current Discharge Medication List             /R. Baldo Daub, MD, [facility]  Department of Emergency Medicine  [facility]

## 2022-09-02 NOTE — Discharge Instructions (Signed)
We recommended and offer continued ED treatment and/or hospital admission to the patient for further evaluation and treatment.  There are risks of worsening illness, long term disability, or even death as a result of declining further treatment. You demonstrate comprehension and decision-making capacity with clear thought process. Because of bad outcomes, which you recognize, you have understanding of why, as the treating physician, I have recommended continued ED treatment and/or admission and the possible adverse outcomes at this time.  All questions have been answered, if you have any further questions or concerns, you can always be reevaluated.  You are encouraged to return to the Emergency Department at any time, and for any reason, to be re-evaluated.

## 2022-09-04 LAB — URINE CULTURE,ROUTINE: URINE CULTURE: 5000 — AB

## 2022-09-06 DIAGNOSIS — I493 Ventricular premature depolarization: Secondary | ICD-10-CM

## 2022-09-06 DIAGNOSIS — I440 Atrioventricular block, first degree: Secondary | ICD-10-CM

## 2022-09-06 DIAGNOSIS — R9431 Abnormal electrocardiogram [ECG] [EKG]: Secondary | ICD-10-CM

## 2022-09-06 LAB — ECG 12 LEAD
Atrial Rate: 72 {beats}/min
Calculated P Axis: 88 degrees
Calculated R Axis: -35 degrees
Calculated T Axis: 57 degrees
PR Interval: 210 ms
QRS Duration: 82 ms
QT Interval: 432 ms
QTC Calculation: 473 ms
Ventricular rate: 72 {beats}/min

## 2022-09-07 LAB — ADULT ROUTINE BLOOD CULTURE, SET OF 2 BOTTLES (BACTERIA AND YEAST)
BLOOD CULTURE, ROUTINE: NO GROWTH
BLOOD CULTURE, ROUTINE: NO GROWTH

## 2022-11-05 ENCOUNTER — Other Ambulatory Visit (INDEPENDENT_AMBULATORY_CARE_PROVIDER_SITE_OTHER): Payer: Self-pay | Admitting: Family Medicine

## 2022-11-20 ENCOUNTER — Ambulatory Visit (INDEPENDENT_AMBULATORY_CARE_PROVIDER_SITE_OTHER): Payer: Medicare Other | Admitting: Family Medicine

## 2022-11-20 ENCOUNTER — Encounter (INDEPENDENT_AMBULATORY_CARE_PROVIDER_SITE_OTHER): Payer: Self-pay | Admitting: Family Medicine

## 2022-11-20 ENCOUNTER — Other Ambulatory Visit (INDEPENDENT_AMBULATORY_CARE_PROVIDER_SITE_OTHER): Payer: Self-pay | Admitting: Family Medicine

## 2022-11-20 ENCOUNTER — Other Ambulatory Visit: Payer: Self-pay

## 2022-11-20 VITALS — BP 120/71 | HR 78 | Temp 97.2°F | Resp 20 | Ht 63.0 in | Wt 128.0 lb

## 2022-11-20 DIAGNOSIS — Z1231 Encounter for screening mammogram for malignant neoplasm of breast: Secondary | ICD-10-CM

## 2022-11-20 DIAGNOSIS — M810 Age-related osteoporosis without current pathological fracture: Secondary | ICD-10-CM

## 2022-11-20 DIAGNOSIS — L819 Disorder of pigmentation, unspecified: Secondary | ICD-10-CM

## 2022-11-20 DIAGNOSIS — I10 Essential (primary) hypertension: Secondary | ICD-10-CM

## 2022-11-20 DIAGNOSIS — I471 Supraventricular tachycardia, unspecified (CMS HCC): Secondary | ICD-10-CM

## 2022-11-20 DIAGNOSIS — E782 Mixed hyperlipidemia: Secondary | ICD-10-CM

## 2022-11-20 NOTE — Progress Notes (Signed)
FAMILY MEDICINE, MEDICAL OFFICE BUILDING  [address]  [place] [address]       Name: Veronica Willis MRN:  [id number]   Date: 11/20/2022 Age: 82 y.o.          Provider: Mickey Farber, DO    Reason for visit: Follow Up 3 Months, Skin Lesions, and Weight Gain      History of Present Illness:  11/20/2022:  This 82 year old female returns for 3 month follow-up to review her labs and get refills on all her medications.  Unfortunately she did not have any labs done recently.  She has lost 1 lb since she was here last.  Skin lesion of the chest about 2 cm raised and crusty multi colors.  She has a skin lesion about 3 cm on her right lower leg the bleed that time and itches in burns and the 1 on the chest sensitive to touch.  She has requesting a referral to Dermatology.  She has had some weight gain in her abdomen start going to the fitness center and I recommend the silver sneakers and also her allergies are acting up and she should take some Allegra or Claritin.  She denies chest pain or shortness a breath and is tolerating her medications well she does not need a Tdap Prevnar 20 in his shingles vaccine.  We ordered a mammogram and a bone density for.  She denies anymore syncope near-syncope 1 episode in which she had positive test with alcohol in her blood.    Historical Data    Past Medical History:  Past Medical History:   Diagnosis Date    Anxiety     Cardiac arrhythmia     Chronic obstructive airway disease (CMS HCC)     Hypercholesterolemia     Hypertension     Hypomagnesemia     Intertrochanteric fracture of left femur (CMS HCC)     Vitamin D deficiency          Past Surgical History:  Past Surgical History:   Procedure Laterality Date    BRONCHOSCOPY      HX CARPAL TUNNEL RELEASE Bilateral     HX CYSTOCELE REPAIR      HX HIP REPLACEMENT Left     HX HIP REPLACEMENT Left          Allergies:  Allergies   Allergen Reactions    Penicillins Nausea/ Vomiting    Sulfa (Sulfonamides) Nausea/ Vomiting      Medications:  Current Outpatient Medications   Medication Sig    albuterol sulfate (PROVENTIL) 2.5 mg /3 mL (0.083 %) Inhalation nebulizer solution USE ONE vial in NEBULIZER THREE TIMES DAILY AS NEEDED (Patient taking differently: Take 3 mL (2.5 mg total) by nebulization Every 4 hours as needed for Wheezing)    busPIRone (BUSPAR) 5 mg Oral Tablet Take 1 Tablet (5 mg total) by mouth Three times a day Indications: repeated episodes of anxiety    ergocalciferol, vitamin D2, (DRISDOL) 1,250 mcg (50,000 unit) Oral Capsule Take 1 Capsule (50,000 Units total) by mouth Every 7 days tuesday    fluorometholone (FML LIQUIFILM) 0.1 % Ophthalmic Drops, Suspension Instill 1 Drop into both eyes Twice daily    ipratropium-albuterol 0.5 mg-3 mg(2.5 mg base)/3 mL Solution for Nebulization Take 3 mL by nebulization Every 4 hours as needed for up to 180 days    losartan (COZAAR) 100 mg Oral Tablet Take 1 Tablet (100 mg total) by mouth Once a day for 180 days  magnesium oxide 400 mg Oral Tablet Take 1 Tablet (400 mg total) by mouth Twice daily    metoprolol succinate (TOPROL-XL) 25 mg Oral Tablet Sustained Release 24 hr Take 1 Tablet (25 mg total) by mouth Twice daily for 90 days    RESTASIS 0.05 % Ophthalmic Dropperette Instill 1 Drop into both eyes Every 12 hours    sertraline (ZOLOFT) 100 mg Oral Tablet Take 1 Tablet (100 mg total) by mouth Once a day for 180 days    simvastatin (ZOCOR) 40 mg Oral Tablet Take 1 Tablet (40 mg total) by mouth Every evening for 180 days     Family History:  Family Medical History:       Problem Relation (Age of Onset)    Congestive Heart Failure Mother    Diabetes type II Sister    Prostate Cancer Father    Stroke Sister            Social History:  Social History     Socioeconomic History    Marital status: Widowed   Tobacco Use    Smoking status: Never     Passive exposure: Past (husband was a smoker)    Smokeless tobacco: Never   Vaping Use    Vaping status: Never Used   Substance and Sexual  Activity    Alcohol use: Yes     Comment: SOCIAL occasional wine    Drug use: Never           Review of Systems:  Any pertinent Review of Systems as addressed in the HPI above.    Physical Exam:  Vital Signs:  Vitals:    11/20/22 1115   BP: 120/71   Pulse: 78   Resp: 20   Temp: 36.2 C (97.2 F)   TempSrc: Temporal   SpO2: 96%   Weight: 58.1 kg (128 lb)   Height: 1.6 m (5\' 3" )   BMI: 22.72     Physical Exam  Vitals and nursing note reviewed.   Constitutional:       General: She is awake.      Appearance: Normal appearance. She is well-developed, well-groomed and normal weight.   HENT:      Head: Normocephalic and atraumatic.      Right Ear: Tympanic membrane, ear canal and external ear normal.      Left Ear: Tympanic membrane, ear canal and external ear normal.      Nose: Nose normal.      Mouth/Throat:      Mouth: Mucous membranes are moist.      Pharynx: Oropharynx is clear.   Eyes:      Extraocular Movements: Extraocular movements intact.      Conjunctiva/sclera: Conjunctivae normal.      Pupils: Pupils are equal, round, and reactive to light.   Cardiovascular:      Rate and Rhythm: Normal rate and regular rhythm.      Heart sounds: Normal heart sounds, S1 normal and S2 normal.   Pulmonary:      Effort: Pulmonary effort is normal.      Breath sounds: Normal breath sounds.   Abdominal:      General: Abdomen is protuberant. Bowel sounds are normal.      Palpations: Abdomen is soft.   Musculoskeletal:         General: Normal range of motion.      Cervical back: Normal range of motion and neck supple.   Skin:     General: Skin is warm and  dry.      Capillary Refill: Capillary refill takes less than 2 seconds.      Findings: Lesion present.             Comments: To pigmented skin lesions 1 on the chest and 1 on the right lower leg.   Neurological:      General: No focal deficit present.      Mental Status: She is alert and oriented to person, place, and time. Mental status is at baseline.   Psychiatric:         Mood  and Affect: Mood normal.         Behavior: Behavior normal. Behavior is cooperative.         Thought Content: Thought content normal.         Judgment: Judgment normal.       Assessment:    ICD-10-CM    1. SVT (supraventricular tachycardia) (CMS HCC)  I47.10       2. Breast cancer screening by mammogram  Z12.31 MAMMO BILATERAL SCREENING      3. Osteoporosis, unspecified osteoporosis type, unspecified pathological fracture presence  M81.0 BONE DENSITOMETRY COMPARISON      4. Pigmented skin lesion of uncertain behavior of torso  L81.9 Referral to External Provider      5. Essential hypertension  I10 CBC/DIFF     BASIC METABOLIC PANEL     URINALYSIS, MACROSCOPIC AND MICROSCOPIC W/CULTURE REFLEX     LIPID PANEL     HEPATIC FUNCTION PANEL     THYROID STIMULATING HORMONE (SENSITIVE TSH)         Plan:  Orders Placed This Encounter    MAMMO BILATERAL SCREENING    BONE DENSITOMETRY COMPARISON    CBC/DIFF    BASIC METABOLIC PANEL    URINALYSIS, MACROSCOPIC AND MICROSCOPIC W/CULTURE REFLEX    LIPID PANEL    HEPATIC FUNCTION PANEL    THYROID STIMULATING HORMONE (SENSITIVE TSH)    Referral to External Provider     Today I referred the patient to Dr. Baldwin [place] to have the skin lesions on her chest and leg removed.  Today I ordered a mammogram and a bone density.  I recommend the patient have a Tdap Prevnar 20 and a shingles vaccine.  We are going to set her up for a Medicare wellness visit.  Also ordered a CBC with diff basic metabolic panel urinalysis lipid panel hepatic function panel thyroid-stimulating hormone to be done in 3 months.  More than 50% of the visit was spent counseling and coordinating care.  All questions were answered to his satisfaction of the patient.  I recommend the patient stay on vitamin-D for vitamin-D deficiency.  I recommend she stay on her Earlie Server for her chronic obstructive pulmonary disease and asthma.  I recommend she stay on a low-fat low-cholesterol heart healthy diet and her Zocor for  hyperlipidemia.  She should stay on her Zoloft and BuSpar for anxiety.  For hypertension she should limit salt to no more than 2 g a day and continue her Cozaar and metoprolol.    Return in about 3 months (around 02/19/2023).    Mickey Farber, DO     Portions of this note may be dictated using voice recognition software or a dictation service. Variances in spelling and vocabulary are possible and unintentional. Not all errors are caught/corrected. Please notify the [facility] if any discrepancies are noted or if the meaning of any statement is not clear.
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
## 2022-11-30 ENCOUNTER — Other Ambulatory Visit (INDEPENDENT_AMBULATORY_CARE_PROVIDER_SITE_OTHER): Payer: Self-pay | Admitting: Family Medicine

## 2022-11-30 MED ORDER — BUSPIRONE 5 MG TABLET
5.0000 mg | ORAL_TABLET | Freq: Three times a day (TID) | ORAL | 1 refills | Status: DC
Start: 2022-11-30 — End: 2023-06-04

## 2022-12-20 ENCOUNTER — Encounter (HOSPITAL_COMMUNITY): Payer: Self-pay

## 2022-12-20 ENCOUNTER — Inpatient Hospital Stay (HOSPITAL_BASED_OUTPATIENT_CLINIC_OR_DEPARTMENT_OTHER)
Admission: RE | Admit: 2022-12-20 | Discharge: 2022-12-20 | Disposition: A | Discharge status: Home / Self Care | Payer: Medicare Other | Source: Ambulatory Visit | Attending: Family Medicine | Admitting: Family Medicine

## 2022-12-20 ENCOUNTER — Other Ambulatory Visit: Payer: Self-pay

## 2022-12-20 ENCOUNTER — Inpatient Hospital Stay
Admission: RE | Admit: 2022-12-20 | Discharge: 2022-12-20 | Disposition: A | Discharge status: Home / Self Care | Payer: Medicare Other | Source: Ambulatory Visit | Attending: Family Medicine | Admitting: Family Medicine

## 2022-12-20 DIAGNOSIS — M810 Age-related osteoporosis without current pathological fracture: Secondary | ICD-10-CM

## 2022-12-20 DIAGNOSIS — M8580 Other specified disorders of bone density and structure, unspecified site: Secondary | ICD-10-CM

## 2022-12-20 DIAGNOSIS — Z1231 Encounter for screening mammogram for malignant neoplasm of breast: Secondary | ICD-10-CM | POA: Insufficient documentation

## 2022-12-21 ENCOUNTER — Encounter (INDEPENDENT_AMBULATORY_CARE_PROVIDER_SITE_OTHER): Payer: Self-pay

## 2023-01-01 ENCOUNTER — Other Ambulatory Visit (INDEPENDENT_AMBULATORY_CARE_PROVIDER_SITE_OTHER): Payer: Self-pay | Admitting: Family Medicine

## 2023-02-19 ENCOUNTER — Ambulatory Visit (INDEPENDENT_AMBULATORY_CARE_PROVIDER_SITE_OTHER): Payer: Medicare Other | Admitting: Family Medicine

## 2023-02-19 ENCOUNTER — Other Ambulatory Visit: Payer: Self-pay

## 2023-02-19 ENCOUNTER — Other Ambulatory Visit: Payer: Medicare Other | Attending: Family Medicine | Admitting: Family Medicine

## 2023-02-19 ENCOUNTER — Encounter (INDEPENDENT_AMBULATORY_CARE_PROVIDER_SITE_OTHER): Payer: Self-pay | Admitting: Family Medicine

## 2023-02-19 VITALS — BP 139/89 | HR 77 | Temp 98.6°F | Resp 19 | Ht 63.0 in | Wt 128.0 lb

## 2023-02-19 VITALS — BP 149/85 | HR 77 | Temp 98.6°F | Resp 19 | Ht 63.0 in | Wt 128.0 lb

## 2023-02-19 DIAGNOSIS — I10 Essential (primary) hypertension: Secondary | ICD-10-CM

## 2023-02-19 DIAGNOSIS — I471 Supraventricular tachycardia, unspecified (CMS HCC): Secondary | ICD-10-CM

## 2023-02-19 DIAGNOSIS — E782 Mixed hyperlipidemia: Secondary | ICD-10-CM

## 2023-02-19 DIAGNOSIS — F411 Generalized anxiety disorder: Secondary | ICD-10-CM

## 2023-02-19 DIAGNOSIS — E559 Vitamin D deficiency, unspecified: Secondary | ICD-10-CM

## 2023-02-19 DIAGNOSIS — Z0000 Encounter for general adult medical examination without abnormal findings: Secondary | ICD-10-CM

## 2023-02-19 DIAGNOSIS — F320 Major depressive disorder, single episode, mild: Secondary | ICD-10-CM

## 2023-02-19 LAB — BASIC METABOLIC PANEL
ANION GAP: 14 mmol/L — ABNORMAL HIGH (ref 4–13)
BUN/CREA RATIO: 22 (ref 6–22)
BUN: 17 mg/dL (ref 7–25)
CALCIUM: 9.2 mg/dL (ref 8.6–10.3)
CHLORIDE: 105 mmol/L (ref 98–107)
CO2 TOTAL: 23 mmol/L (ref 21–31)
CREATININE: 0.77 mg/dL (ref 0.60–1.30)
ESTIMATED GFR: 77 mL/min/{1.73_m2} (ref >59)
GLUCOSE: 103 mg/dL (ref 74–109)
OSMOLALITY, CALCULATED: 285 mOsm/kg (ref 270–290)
POTASSIUM: 4 mmol/L (ref 3.5–5.1)
SODIUM: 142 mmol/L (ref 136–145)

## 2023-02-19 LAB — CBC WITH DIFF
BASOPHIL #: 0 10*3/uL (ref 0.00–0.10)
BASOPHIL %: 1 % (ref 0–1)
EOSINOPHIL #: 0.2 10*3/uL (ref 0.00–0.50)
EOSINOPHIL %: 4 %
HCT: 42.3 % — ABNORMAL HIGH (ref 31.2–41.9)
HGB: 14.3 g/dL (ref 10.9–14.3)
LYMPHOCYTE #: 1.1 10*3/uL (ref 1.00–3.00)
LYMPHOCYTE %: 22 % (ref 16–44)
MCH: 33.2 pg — ABNORMAL HIGH (ref 24.7–32.8)
MCHC: 33.8 g/dL (ref 32.3–35.6)
MCV: 98.1 fL — ABNORMAL HIGH (ref 75.5–95.3)
MONOCYTE #: 0.3 10*3/uL (ref 0.30–1.00)
MONOCYTE %: 6 % (ref 5–13)
MPV: 7.8 fL — ABNORMAL LOW (ref 7.9–10.8)
NEUTROPHIL #: 3.5 10*3/uL (ref 1.85–7.80)
NEUTROPHIL %: 67 % (ref 43–77)
PLATELETS: 208 10*3/uL (ref 140–440)
RBC: 4.31 10*6/uL (ref 3.63–4.92)
RDW: 13.1 % (ref 12.3–17.7)
WBC: 5.3 10*3/uL (ref 3.8–11.8)

## 2023-02-19 LAB — URINALYSIS, MICROSCOPIC
RBCS: 22 /hpf — ABNORMAL HIGH (ref <4)
SQUAMOUS EPITHELIAL: 9 /hpf (ref <28)
WBCS: 7 /hpf — ABNORMAL HIGH (ref <6)

## 2023-02-19 LAB — URINALYSIS, MACROSCOPIC
BILIRUBIN: NEGATIVE mg/dL
BLOOD: NEGATIVE mg/dL
GLUCOSE: NEGATIVE mg/dL
KETONES: NEGATIVE mg/dL
LEUKOCYTES: 500 WBCs/uL — AB
NITRITE: NEGATIVE
PH: 6 (ref 5.0–9.0)
PROTEIN: 20 mg/dL
SPECIFIC GRAVITY: 1.024 (ref 1.002–1.030)
UROBILINOGEN: NORMAL mg/dL

## 2023-02-19 LAB — LIPID PANEL
CHOL/HDL RATIO: 2
CHOLESTEROL: 173 mg/dL (ref <200)
HDL CHOL: 86 mg/dL (ref >=40)
LDL CALC: 70 mg/dL (ref 0–100)
TRIGLYCERIDES: 84 mg/dL (ref <=150)
VLDL CALC: 17 mg/dL (ref 0–50)

## 2023-02-19 LAB — HEPATIC FUNCTION PANEL
ALBUMIN/GLOBULIN RATIO: 1.3 (ref 0.8–1.4)
ALBUMIN: 4.3 g/dL (ref 3.5–5.7)
ALKALINE PHOSPHATASE: 52 U/L (ref 34–104)
ALT (SGPT): 17 U/L (ref 7–52)
AST (SGOT): 28 U/L (ref 13–39)
BILIRUBIN DIRECT: 0.22 md/dL — ABNORMAL HIGH (ref 0.03–0.18)
BILIRUBIN TOTAL: 0.8 mg/dL (ref 0.3–1.2)
BILIRUBIN, INDIRECT: 0.58 mg/dL (ref <=1)
GLOBULIN: 3.3 (ref 2.9–5.4)
PROTEIN TOTAL: 7.6 g/dL (ref 6.4–8.9)

## 2023-02-19 LAB — THYROID STIMULATING HORMONE (SENSITIVE TSH): TSH: 1.972 u[IU]/mL (ref 0.450–5.330)
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
## 2023-02-19 NOTE — Nursing Note (Signed)
02/19/23 1155   Medicare Wellness Assessment   Medicare initial or wellness physical in the last year? Yes   Advance Directives   Does patient have a living will or MPOA Yes   Has patient provided [employer] with a copy? Yes   Activities of Daily Living   Do you need help with dressing, bathing, or walking? No   Do you need help with shopping, housekeeping, medications, or finances? Yes  (daughter)   Do you have rugs in hallways, broken steps, or poor lighting? No   Do you have grab bars in your bathroom, non-slip strips in your tub, and hand rails on your stairs? Yes   Cognitive Function Screen   What is you age? 1  (78)   What is the time to the nearest hour? 1  (12:00)   What is the year? 1  (2024)   What is the name of this clinic? 1  ([facility])   Can the patient recognize two persons (the doctor, the nurse, home help, etc.)? 1   What is the date of your birth? (day and month sufficient)  1  ([birth year])   In what year did World War II end? 0   Who is the current president of the [place]? 1  (Biden)   Count from 20 down to 1? 1   What address did I give you earlier? 0   Total Score 8   Interpretation of Total Score Greater than 6 Normal   Depression Screen   Little interest or pleasure in doing things. 0   Feeling down, depressed, or hopeless 0   PHQ 2 Total 0   Pain Score   Pain Score Two  (joints)   Substance Use Screening   In Past 12 MONTHS, how often have you used any tobacco product (for example, cigarettes, e-cigarettes, cigars, pipes, or smokeless tobacco)? Never   In the PAST 12 MONTHS, how often have you had 5 (men)/4 (women) or more drinks containing alcohol in one day? Never   In the PAST 12 months, how often have you used any prescription medications just for the feeling, more than prescribed, or that were not prescribed for you? Prescriptions may include: opioids, benzodiazepines, medications for ADHD Never   In the PAST 12 MONTHS, how often have you used any drugs, including  marijuana, cocaine or crack, heroin, methamphetamine, hallucinogens, ecstasy/MDMA? Never   Hearing Screen   Have you noticed any hearing difficulties? Yes   After whispering 9-1-6 how many numbers did the patient repeat correctly? 3   After whispering 4-7-8 how many numbers did the patient repeat correctly? 3   Total Correct 6   Fall Risk Assessment   Do you feel unsteady when standing or walking? Yes   Do you worry about falling? Yes   Have you fallen in the past year? No   Were you ever injured from falling? No   Vision Screen   Right Eye = 20 0.67   Left Eye = 20 1   Urinary Incontinence Screen   Do you ever leak urine when you don't want to? No

## 2023-02-19 NOTE — Nursing Note (Signed)
02/19/23 1152   Comprehensive Health Assessment-Adult   Do you wish to complete this form? Yes   During the past 4 weeks, how would you rate your health in general? Fair   During the past 4 weeks, how much difficulty have you had doing your usual activities inside and outside your home because of medical or emotional problems? A little bit of difficulty   During the past 4 weeks, was someone available to help you if you needed and wanted help? Yes, as much as I wanted   In the past year, how many times have you gone to the emergency department or been admitted to a hospital for a health problem? 2-4 times   Are you generally satisfied with your sleep? Yes   Do you have enough money to buy things you need in everyday life, such as food, clothing, medicines, and housing? Yes, always   Can you get to places beyond walking distance without help?  (For example, can you drive your own car or travel alone on buses)? Yes   Do you fasten your seatbelt when you are in a car? Yes, usually  (silver sneakers and water aerobics at [employer])   Do you exercise 20 minutes 3 or more days per week (such as walking, dancing, biking, mowing grass, swimming)? Yes, some of the time   How often do you eat food that is healthy (fruits, vegetables, lean meats) instead of unhealthy (sweets, fast food, junk food, fatty foods)? Most of the time   How often do you have trouble taking medicines the eay you are told to take them? I always take them as prescribed   Do you need any help communicating with your doctors and nurses because of vision or hearing problems? No   During the past 12 months, have you experienced confusion or memory loss that is happening more often or is getting worse? No   Do you have one person you think of as your personal doctor (primary care provider or family doctor)? Yes  (Dr. Mickey Farber)   If you are seeing a Primary Care Provider (PCP) or family doctor. please list their name Dr. Gaynell Face Long   Are you  now also seeing any specialist physician(s) (such as eye doctor, foot doctor, skin doctor)? Yes   If you are seeing a specialist for anything such as foot, eye, skin, etc.  please list their name(s) Dr. Van Clines, Dr. Terrilee Croak, Dr. Maurice March   How confident are you that you can control or manage most of your health problems? Somewhat confident

## 2023-02-19 NOTE — Progress Notes (Addendum)
This visit actually should have been an initial Medicare wellness visit.  He should be a G0 [id number]   [facility]ILY [facility], MEDICAL OFFICE BUILDING  [address]  [place] [address]    Medicare Annual Wellness Visit    Name: Veronica Willis MRN:  [id number]   Date: 02/19/2023 Age: 82 y.o.       SUBJECTIVE:   Veronica Willis is a 82 y.o. female for presenting for Medicare Wellness exam.   I have reviewed and reconciled the medication list with the patient today.        02/19/2023    11:52 AM   Comprehensive Health Assessment-Adult   Do you wish to complete this form? Yes   During the past 4 weeks, how would you rate your health in general? Fair   During the past 4 weeks, how much difficulty have you had doing your usual activities inside and outside your home because of medical or emotional problems? A little bit of difficulty   During the past 4 weeks, was someone available to help you if you needed and wanted help? Yes, as much as I wanted   In the past year, how many times have you gone to the emergency department or been admitted to a hospital for a health problem? 2-4 times   Are you generally satisfied with your sleep? Yes   Do you have enough money to buy things you need in everyday life, such as food, clothing, medicines, and housing? Yes, always   Can you get to places beyond walking distance without help?  (For example, can you drive your own car or travel alone on buses)? Yes   Do you fasten your seatbelt when you are in a car? Yes, usually       silver sneakers and water aerobics at [employer]   Do you exercise 20 minutes 3 or more days per week (such as walking, dancing, biking, mowing grass, swimming)? Yes, some of the time   How often do you eat food that is healthy (fruits, vegetables, lean meats) instead of unhealthy (sweets, fast food, junk food, fatty foods)? Most of the time   How often do you have trouble taking medicines the eay you are told to take them? I always take them as prescribed   Do you need  any help communicating with your doctors and nurses because of vision or hearing problems? No   During the past 12 months, have you experienced confusion or memory loss that is happening more often or is getting worse? No   Do you have one person you think of as your personal doctor (primary care provider or family doctor)? Yes       Dr. Mickey Farber   If you are seeing a Primary Care Provider (PCP) or family doctor. please list their name Dr. Gaynell Face Brant Peets   Are you now also seeing any specialist physician(s) (such as eye doctor, foot doctor, skin doctor)? Yes   If you are seeing a specialist for anything such as foot, eye, skin, etc.  please list their name(s) Dr. Van Clines, Dr. Terrilee Croak, Dr. Maurice March   How confident are you that you can control or manage most of your health problems? Somewhat confident       I have reviewed and updated as appropriate the past medical, family and social history. 02/19/2023 as summarized below:  Past Medical History:   Diagnosis Date    Anxiety     Cardiac arrhythmia  Chronic obstructive airway disease (CMS HCC)     Hypercholesterolemia     Hypertension     Hypomagnesemia     Intertrochanteric fracture of left femur (CMS HCC)     Vitamin D deficiency      Past Surgical History:   Procedure Laterality Date    Bronchoscopy      Hx carpal tunnel release Bilateral     Hx cystocele repair      Hx hip replacement Left     Hx hip replacement Left      Current Outpatient Medications   Medication Sig    albuterol sulfate (PROVENTIL) 2.5 mg /3 mL (0.083 %) Inhalation nebulizer solution USE ONE vial in NEBULIZER THREE TIMES DAILY AS NEEDED (Patient taking differently: Take 3 mL (2.5 mg total) by nebulization Every 4 hours as needed for Wheezing)    busPIRone (BUSPAR) 5 mg Oral Tablet Take 1 Tablet (5 mg total) by mouth Three times a day Indications: repeated episodes of anxiety    ergocalciferol, vitamin D2, (DRISDOL) 1,250 mcg (50,000 unit) Oral Capsule {take ONE capsule by MOUTH ONCE WEEKLY]     fluorometholone (FML LIQUIFILM) 0.1 % Ophthalmic Drops, Suspension Instill 1 Drop into both eyes Twice daily    ipratropium-albuterol 0.5 mg-3 mg(2.5 mg base)/3 mL Solution for Nebulization Take 3 mL by nebulization Every 4 hours as needed for up to 180 days    losartan (COZAAR) 100 mg Oral Tablet Take 1 Tablet (100 mg total) by mouth Once a day for 180 days    magnesium oxide 400 mg Oral Tablet Take 1 Tablet (400 mg total) by mouth Twice daily    metoprolol succinate (TOPROL-XL) 25 mg Oral Tablet Sustained Release 24 hr Take 1 Tablet (25 mg total) by mouth Twice daily for 90 days    RESTASIS 0.05 % Ophthalmic Dropperette Instill 1 Drop into both eyes Every 12 hours    sertraline (ZOLOFT) 100 mg Oral Tablet Take 1 Tablet (100 mg total) by mouth Once a day for 180 days    simvastatin (ZOCOR) 40 mg Oral Tablet Take 1 Tablet (40 mg total) by mouth Every evening for 180 days     Family Medical History:       Problem Relation (Age of Onset)    Congestive Heart Failure Mother    Diabetes type II Sister    Prostate Cancer Father    Stroke Sister            Social History     Socioeconomic History    Marital status: Widowed   Tobacco Use    Smoking status: Never     Passive exposure: Past (husband was a smoker)    Smokeless tobacco: Never   Vaping Use    Vaping status: Never Used   Substance and Sexual Activity    Alcohol use: Yes     Comment: SOCIAL occasional wine    Drug use: Never     Social Determinants of Health     Health Literacy: Low Risk  (08/21/2022)    Health Literacy     SDOH Health Literacy: Never         List of Current Health Care Providers   Care Team       PCP       Name Type Specialty Phone Number    Erena, Ducre, DO Physician [facility] [phone]              Care Team  No care team found                      Health Maintenance   Topic Date Due    Pneumococcal Vaccination, Age 55+ (1 of 2 - PCV) Never done    Adult Tdap-Td (1 - Tdap) Never done    Shingles Vaccine (1 of 2) Never done     Covid-19 Vaccine (7 - 2023-24 season) 09/30/2022    Influenza Vaccine (1) 03/31/2023    Depression Screening  02/19/2024    Medicare Annual Wellness Visit  02/19/2024    Osteoporosis screening  12/19/2024    RSV Pregnant or 60+  Completed    Meningococcal Vaccine  Aged Out     Medicare Wellness Assessment   Medicare initial or wellness physical in the last year?: Yes  Advance Directives   Does patient have a living will or MPOA: Yes   Has patient provided [employer] with a copy?: Yes              Activities of Daily Living   Do you need help with dressing, bathing, or walking?: No   Do you need help with shopping, housekeeping, medications, or finances?: Yes (daughter)   Do you have rugs in hallways, broken steps, or poor lighting?: No   Do you have grab bars in your bathroom, non-slip strips in your tub, and hand rails on your stairs?: Yes   Cognitive Function Screen (1=Yes, 0=No)   What is you age?: Correct (82)   What is the time to the nearest hour?: Correct (12:00)   What is the year?: Correct (2024)   What is the name of this clinic?: Correct ([facility])   Can the patient recognize two persons (the doctor, the nurse, home help, etc.)?: Correct   What is the date of your birth? (day and month sufficient) : Correct (09-13-1940)   In what year did World War II end?: Incorrect   Who is the current president of the [place]?: Correct (Biden)   Count from 20 down to 1?: Correct   What address did I give you earlier?: Incorrect   Total Score: 8   Interpretation of Total Score: Greater than 6 Normal   Fall Risk Screen   Do you feel unsteady when standing or walking?: Yes  Do you worry about falling?: Yes  Have you fallen in the past year?: No  Were you ever injured from falling?: No   Depression Screen     Little interest or pleasure in doing things.: Not at all  Feeling down, depressed, or hopeless: Not at all  PHQ 2 Total: 0     Pain Score   Pain Score:   2 (joints)    Substance Use-Abuse Screening      Tobacco Use     In Past 12 MONTHS, how often have you used any tobacco product (for example, cigarettes, e-cigarettes, cigars, pipes, or smokeless tobacco)?: Never     Alcohol use     In the PAST 12 MONTHS, how often have you had 5 (men)/4 (women) or more drinks containing alcohol in one day?: Never     Prescription Drug Use     In the PAST 12 months, how often have you used any prescription medications just for the feeling, more than prescribed, or that were not prescribed for you? Prescriptions may include: opioids, benzodiazepines, medications for ADHD: Never           Illicit  Drug Use   In the PAST 12 MONTHS, how often have you used any drugs, including marijuana, cocaine or crack, heroin, methamphetamine, hallucinogens, ecstasy/MDMA?: Never        Hearing Screen   Have you noticed any hearing difficulties?: Yes  After whispering 9-1-6 how many numbers did the patient repeat correctly?: 3  After whispering 4-7-8 how many numbers did the patient repeat correctly?: 3       Vision Screen   Right Eye = 20: 0.67   Left Eye = 20: 1     Urine Incontinence Screen   Urinary Incontinence Screen  Do you ever leak urine when you don't want to?: No                     OBJECTIVE:   BP 139/89 (Site: Left Arm, Patient Position: Sitting, Cuff Size: Adult)   Pulse 77   Temp 37 C (98.6 F) (Temporal)   Resp 19   Ht 1.6 m (5\' 3" )   Wt 58.1 kg (128 lb)   SpO2 97%   BMI 22.67 kg/m        Other appropriate exam:    Health Maintenance Due   Topic Date Due    Pneumococcal Vaccination, Age 35+ (1 of 2 - PCV) Never done    Adult Tdap-Td (1 - Tdap) Never done    Shingles Vaccine (1 of 2) Never done    Covid-19 Vaccine (7 - 2023-24 season) 09/30/2022      ASSESSMENT & PLAN:  Problem List Items Addressed This Visit          Cardiovascular System    SVT (supraventricular tachycardia) (CMS HCC)    Essential hypertension - Primary       Psychiatric    GAD (generalized anxiety disorder)    Mild major depression (CMS HCC)         Identified Risk Factors/ Recommended Actions     Fall Risk Follow up plan of care: Vitamin D supplementation advised  Discussed optimizing home safety  Manage & monitor hypotension  Medications managed to minimize fall risk  Footwear and potential problems addressed  The PHQ 2 Total: 0 depression screen is interpreted as negative.              No orders of the defined types were placed in this encounter.         The patient has been educated about risk factors and recommended preventive care. Written Prevention Plan completed/ updated and given to patient (see After Visit Summary).    Return in about 1 year (around 02/19/2024).    Mickey Farber, DO
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
## 2023-02-20 ENCOUNTER — Encounter (INDEPENDENT_AMBULATORY_CARE_PROVIDER_SITE_OTHER): Payer: Self-pay

## 2023-02-21 LAB — URINE CULTURE,ROUTINE: URINE CULTURE: >100000 — AB

## 2023-03-07 ENCOUNTER — Other Ambulatory Visit (INDEPENDENT_AMBULATORY_CARE_PROVIDER_SITE_OTHER): Payer: Self-pay

## 2023-05-22 ENCOUNTER — Ambulatory Visit (INDEPENDENT_AMBULATORY_CARE_PROVIDER_SITE_OTHER): Payer: Medicare Other | Admitting: Family Medicine

## 2023-05-22 ENCOUNTER — Encounter (INDEPENDENT_AMBULATORY_CARE_PROVIDER_SITE_OTHER): Payer: Self-pay | Admitting: Family Medicine

## 2023-05-22 ENCOUNTER — Other Ambulatory Visit: Payer: Medicare Other | Attending: Family Medicine | Admitting: Family Medicine

## 2023-05-22 ENCOUNTER — Other Ambulatory Visit: Payer: Self-pay

## 2023-05-22 VITALS — BP 114/79 | HR 71 | Temp 98.8°F | Resp 16 | Ht 63.0 in | Wt 128.0 lb

## 2023-05-22 DIAGNOSIS — F411 Generalized anxiety disorder: Secondary | ICD-10-CM

## 2023-05-22 DIAGNOSIS — I471 Supraventricular tachycardia, unspecified (CMS HCC): Secondary | ICD-10-CM

## 2023-05-22 DIAGNOSIS — Z23 Encounter for immunization: Secondary | ICD-10-CM | POA: Insufficient documentation

## 2023-05-22 DIAGNOSIS — I10 Essential (primary) hypertension: Secondary | ICD-10-CM | POA: Insufficient documentation

## 2023-05-22 DIAGNOSIS — I700 Atherosclerosis of aorta: Secondary | ICD-10-CM

## 2023-05-22 DIAGNOSIS — E782 Mixed hyperlipidemia: Secondary | ICD-10-CM

## 2023-05-22 DIAGNOSIS — Z1231 Encounter for screening mammogram for malignant neoplasm of breast: Secondary | ICD-10-CM

## 2023-05-22 DIAGNOSIS — F320 Major depressive disorder, single episode, mild: Secondary | ICD-10-CM

## 2023-05-22 LAB — CBC WITH DIFF
BASOPHIL #: 0 10*3/uL (ref 0.00–0.10)
BASOPHIL %: 1 % (ref 0–1)
EOSINOPHIL #: 0.2 10*3/uL (ref 0.00–0.50)
EOSINOPHIL %: 3 % (ref 1–7)
HCT: 44.9 % — ABNORMAL HIGH (ref 31.2–41.9)
HGB: 15 g/dL — ABNORMAL HIGH (ref 10.9–14.3)
LYMPHOCYTE #: 1 10*3/uL (ref 1.00–3.00)
LYMPHOCYTE %: 15 % — ABNORMAL LOW (ref 16–44)
MCH: 32.6 pg (ref 24.7–32.8)
MCHC: 33.4 g/dL (ref 32.3–35.6)
MCV: 97.5 fL — ABNORMAL HIGH (ref 75.5–95.3)
MONOCYTE #: 0.4 10*3/uL (ref 0.30–1.00)
MONOCYTE %: 7 % (ref 5–13)
MPV: 8.1 fL (ref 7.9–10.8)
NEUTROPHIL #: 4.8 10*3/uL (ref 1.85–7.80)
NEUTROPHIL %: 75 % (ref 43–77)
PLATELETS: 253 10*3/uL (ref 140–440)
RBC: 4.6 10*6/uL (ref 3.63–4.92)
RDW: 12.8 % (ref 12.3–17.7)
WBC: 6.4 10*3/uL (ref 3.8–11.8)

## 2023-05-22 LAB — URINALYSIS, MACROSCOPIC
BILIRUBIN: NEGATIVE mg/dL
BLOOD: NEGATIVE mg/dL
GLUCOSE: NEGATIVE mg/dL
KETONES: NEGATIVE mg/dL
LEUKOCYTES: 500 WBCs/uL — AB
NITRITE: NEGATIVE
PH: 6 (ref 5.0–9.0)
PROTEIN: 20 mg/dL
SPECIFIC GRAVITY: 1.021 (ref 1.002–1.030)
UROBILINOGEN: NORMAL mg/dL

## 2023-05-22 LAB — HEPATIC FUNCTION PANEL
ALBUMIN/GLOBULIN RATIO: 1.4 (ref 0.8–1.4)
ALBUMIN: 4.5 g/dL (ref 3.5–5.7)
ALKALINE PHOSPHATASE: 65 U/L (ref 34–104)
ALT (SGPT): 16 U/L (ref 7–52)
AST (SGOT): 24 U/L (ref 13–39)
BILIRUBIN DIRECT: 0.1 md/dL (ref 0.03–0.18)
BILIRUBIN TOTAL: 0.7 mg/dL (ref 0.3–1.0)
BILIRUBIN, INDIRECT: 0.6 mg/dL (ref <=1)
GLOBULIN: 3.2 (ref 2.9–5.4)
PROTEIN TOTAL: 7.7 g/dL (ref 6.4–8.9)

## 2023-05-22 LAB — THYROID STIMULATING HORMONE (SENSITIVE TSH): TSH: 2.242 u[IU]/mL (ref 0.450–5.330)

## 2023-05-22 LAB — LIPID PANEL
CHOL/HDL RATIO: 2
CHOLESTEROL: 180 mg/dL (ref <200)
HDL CHOL: 90 mg/dL (ref >=40)
LDL CALC: 69 mg/dL (ref 0–100)
TRIGLYCERIDES: 104 mg/dL (ref <=150)
VLDL CALC: 21 mg/dL (ref 0–50)

## 2023-05-22 LAB — BASIC METABOLIC PANEL
ANION GAP: 4 mmol/L (ref 4–13)
BUN/CREA RATIO: 22 (ref 6–22)
BUN: 17 mg/dL (ref 7–25)
CALCIUM: 9.6 mg/dL (ref 8.6–10.3)
CHLORIDE: 104 mmol/L (ref 98–107)
CO2 TOTAL: 28 mmol/L (ref 21–31)
CREATININE: 0.77 mg/dL (ref 0.60–1.30)
ESTIMATED GFR: 77 mL/min/{1.73_m2} (ref >59)
GLUCOSE: 116 mg/dL — ABNORMAL HIGH (ref 74–109)
OSMOLALITY, CALCULATED: 275 mosm/kg (ref 270–290)
POTASSIUM: 4.1 mmol/L (ref 3.5–5.1)
SODIUM: 136 mmol/L (ref 136–145)

## 2023-05-22 LAB — URINALYSIS, MICROSCOPIC
RBCS: 5 /[HPF] — ABNORMAL HIGH (ref <4)
SQUAMOUS EPITHELIAL: 11 /[HPF] (ref <28)
WBCS: 3 /[HPF] (ref <6)

## 2023-05-22 MED ORDER — METOPROLOL SUCCINATE ER 25 MG TABLET,EXTENDED RELEASE 24 HR
25.0000 mg | ORAL_TABLET | Freq: Two times a day (BID) | ORAL | 2 refills | Status: DC
Start: 2023-05-22 — End: 2023-10-30

## 2023-05-22 MED ORDER — SIMVASTATIN 40 MG TABLET
40.0000 mg | ORAL_TABLET | Freq: Every evening | ORAL | 1 refills | Status: DC
Start: 2023-05-22 — End: 2023-11-26

## 2023-05-22 MED ORDER — LOSARTAN 100 MG TABLET
100.0000 mg | ORAL_TABLET | Freq: Every day | ORAL | 1 refills | Status: DC
Start: 2023-05-22 — End: 2023-11-26

## 2023-05-22 MED ORDER — SERTRALINE 100 MG TABLET
100.0000 mg | ORAL_TABLET | Freq: Every day | ORAL | 1 refills | Status: DC
Start: 2023-05-22 — End: 2023-11-26
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
## 2023-05-22 NOTE — Nursing Note (Signed)
05/22/23 1146   PHQ 9 (follow up)   Little interest or pleasure in doing things. 0   Feeling down, depressed, or hopeless 0

## 2023-05-22 NOTE — Progress Notes (Signed)
FAMILY MEDICINE, MEDICAL OFFICE BUILDING  [address]  [place] [address]       Name: Veronica Willis MRN:  [id number]   Date: 05/22/2023 Age: 82 y.o.          Provider: Mickey Farber, DO    Reason for visit: Follow Up 3 Months and Labs Only      History of Present Illness:  05/22/2023:  This 82 year old female returns for 3 month follow-up complains of no taste leads his part from her medications and dry mouth Dr. Allena Katz started her on breasts 3 and ordered a chest x-ray for 4 months.  Her last labs were in July.  She had labs ordered for this visit but did not get them done.  He is exercising 2 times weekly with silver sneakers.  She needs a refill on her losartan Toprol Zocor and Zoloft.  She denies chest pain or shortness for breath except on exertion and is tolerating her medications well.  She denies nausea vomiting diarrhea constipation tachycardia bradycardia syncope or near-syncope.  Historical Data    Past Medical History:  Past Medical History:   Diagnosis Date    Anxiety     Cardiac arrhythmia     Chronic obstructive airway disease (CMS HCC)     Hypercholesterolemia     Hypertension     Hypomagnesemia     Intertrochanteric fracture of left femur (CMS HCC)     Vitamin D deficiency          Past Surgical History:  Past Surgical History:   Procedure Laterality Date    BRONCHOSCOPY      HX CARPAL TUNNEL RELEASE Bilateral     HX CYSTOCELE REPAIR      HX HIP REPLACEMENT Left     HX HIP REPLACEMENT Left          Allergies:  Allergies   Allergen Reactions    Penicillins Nausea/ Vomiting    Sulfa (Sulfonamides) Nausea/ Vomiting     Medications:  Current Outpatient Medications   Medication Sig    albuterol sulfate (PROVENTIL) 2.5 mg /3 mL (0.083 %) Inhalation nebulizer solution USE ONE vial in NEBULIZER THREE TIMES DAILY AS NEEDED (Patient taking differently: Take 3 mL (2.5 mg total) by nebulization Every 4 hours as needed for Wheezing)    busPIRone (BUSPAR) 5 mg Oral Tablet Take 1 Tablet (5 mg total)  by mouth Three times a day Indications: repeated episodes of anxiety    ergocalciferol, vitamin D2, (DRISDOL) 1,250 mcg (50,000 unit) Oral Capsule {take ONE capsule by MOUTH ONCE WEEKLY]    fluorometholone (FML LIQUIFILM) 0.1 % Ophthalmic Drops, Suspension Instill 1 Drop into both eyes Twice daily    ipratropium-albuterol 0.5 mg-3 mg(2.5 mg base)/3 mL Solution for Nebulization Take 3 mL by nebulization Every 4 hours as needed for up to 180 days    losartan (COZAAR) 100 mg Oral Tablet Take 1 Tablet (100 mg total) by mouth Once a day for 180 days    magnesium oxide 400 mg Oral Tablet Take 1 Tablet (400 mg total) by mouth Twice daily    metoprolol succinate (TOPROL-XL) 25 mg Oral Tablet Sustained Release 24 hr Take 1 Tablet (25 mg total) by mouth Twice daily for 90 days    RESTASIS 0.05 % Ophthalmic Dropperette Instill 1 Drop into both eyes Every 12 hours    sertraline (ZOLOFT) 100 mg Oral Tablet Take 1 Tablet (100 mg total) by mouth Once a day for 180 days  simvastatin (ZOCOR) 40 mg Oral Tablet Take 1 Tablet (40 mg total) by mouth Every evening for 180 days     Family History:  Family Medical History:       Problem Relation (Age of Onset)    Congestive Heart Failure Mother    Diabetes type II Sister    Prostate Cancer Father    Stroke Sister            Social History:  Social History     Socioeconomic History    Marital status: Widowed   Tobacco Use    Smoking status: Never     Passive exposure: Past (husband was a smoker)    Smokeless tobacco: Never   Vaping Use    Vaping status: Never Used   Substance and Sexual Activity    Alcohol use: Yes     Comment: SOCIAL occasional wine    Drug use: Never           Review of Systems:  Any pertinent Review of Systems as addressed in the HPI above.    Physical Exam:  Vital Signs:  Vitals:    05/22/23 1147   BP: 114/79   Pulse: 71   Resp: 16   Temp: 37.1 C (98.8 F)   TempSrc: Temporal   SpO2: 96%   Weight: 58.1 kg (128 lb)   Height: 1.6 m (5\' 3" )   BMI: 22.72     Physical  Exam  Vitals and nursing note reviewed.   Constitutional:       General: She is awake.      Appearance: Normal appearance. She is well-developed, well-groomed and normal weight.   HENT:      Head: Normocephalic and atraumatic.      Right Ear: Tympanic membrane, ear canal and external ear normal.      Left Ear: Tympanic membrane, ear canal and external ear normal.      Nose: Nose normal.      Mouth/Throat:      Mouth: Mucous membranes are moist.      Pharynx: Oropharynx is clear.   Eyes:      Extraocular Movements: Extraocular movements intact.      Conjunctiva/sclera: Conjunctivae normal.      Pupils: Pupils are equal, round, and reactive to light.   Cardiovascular:      Rate and Rhythm: Normal rate and regular rhythm.      Pulses:           Carotid pulses are 2+ on the right side and 2+ on the left side.       Radial pulses are 2+ on the right side and 2+ on the left side.        Dorsalis pedis pulses are 2+ on the right side and 2+ on the left side.        Posterior tibial pulses are 2+ on the right side and 2+ on the left side.      Heart sounds: Normal heart sounds, S1 normal and S2 normal.   Pulmonary:      Effort: Pulmonary effort is normal.      Breath sounds: Normal breath sounds.   Abdominal:      General: Abdomen is flat. Bowel sounds are normal.      Palpations: Abdomen is soft.      Tenderness: There is no abdominal tenderness. There is no right CVA tenderness or left CVA tenderness.   Musculoskeletal:         General: Normal  range of motion.      Cervical back: Normal range of motion and neck supple.      Right lower leg: No edema.      Left lower leg: No edema.   Skin:     General: Skin is warm and dry.      Capillary Refill: Capillary refill takes less than 2 seconds.   Neurological:      General: No focal deficit present.      Mental Status: She is alert and oriented to person, place, and time. Mental status is at baseline.      Cranial Nerves: Cranial nerves 2-12 are intact.      Sensory: Sensation  is intact.      Motor: Motor function is intact.      Coordination: Coordination is intact.      Gait: Gait is intact.   Psychiatric:         Mood and Affect: Mood normal.         Behavior: Behavior normal. Behavior is cooperative.         Thought Content: Thought content normal.         Judgment: Judgment normal.       Assessment:    ICD-10-CM    1. 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 Atherosclerosis of aorta (CMS HCC)  I70.0       3. Need for immunization against influenza  Z23 Flu Vaccine, 65+,0.5 mL IM (Admin)      4. Essential hypertension  I10 CBC/DIFF     BASIC METABOLIC PANEL     URINALYSIS, MACROSCOPIC AND MICROSCOPIC W/CULTURE REFLEX     LIPID PANEL     HEPATIC FUNCTION PANEL     THYROID STIMULATING HORMONE (SENSITIVE TSH)      5. Hyperlipidemia, mixed  E78.2       6. SVT (supraventricular tachycardia) (CMS HCC)  I47.10       7. GAD (generalized anxiety disorder)  F41.1       8. Encounter for screening mammogram for malignant neoplasm of breast  Z12.31 MAMMO BILATERAL SCREENING         Plan:  Orders Placed This Encounter    MAMMO BILATERAL SCREENING    Flu Vaccine, 65+,0.5 mL IM (Admin)    CBC WITH DIFF    URINALYSIS, MACROSCOPIC    URINALYSIS, MICROSCOPIC    CBC/DIFF    BASIC METABOLIC PANEL    URINALYSIS, MACROSCOPIC AND MICROSCOPIC W/CULTURE REFLEX    LIPID PANEL    HEPATIC FUNCTION PANEL    THYROID STIMULATING HORMONE (SENSITIVE TSH)    losartan (COZAAR) 100 mg Oral Tablet    metoprolol succinate (TOPROL-XL) 25 mg Oral Tablet Sustained Release 24 hr    sertraline (ZOLOFT) 100 mg Oral Tablet    simvastatin (ZOCOR) 40 mg Oral Tablet     Today I refilled her Zoloft for mild major depression I refilled her losartan and metoprolol for hypertension and I recommended she limit salt to no more than 2 g a day.  Also recommended she continue silver sneakers because this is an excellent form of exercise.  I refilled her Zocor for mixed hyperlipidemia plus I recommended he stay heart healthy low-fat  low-cholesterol diet and avoid high fructose corn syrup and concentrated sweets.  Today we ordered a mammogram and she got a high-dose flu shot.  In addition 3 months from now I have ordered thyroid-stimulating hormone hepatic function panel lipid panel urinalysis  basic metabolic panel CBC diff. more than 50% of the visit was spent counseling and coordinating patient care.  All questions were answered to his satisfaction of the patient    Return in about 3 months (around 08/22/2023).    Mickey Farber, DO     Portions of this note may be dictated using voice recognition software or a dictation service. Variances in spelling and vocabulary are possible and unintentional. Not all errors are caught/corrected. Please notify the [facility] if any discrepancies are noted or if the meaning of any statement is not clear.

## 2023-05-23 ENCOUNTER — Encounter (INDEPENDENT_AMBULATORY_CARE_PROVIDER_SITE_OTHER): Payer: Self-pay

## 2023-05-24 LAB — URINE CULTURE,ROUTINE: URINE CULTURE: 50000 — AB

## 2023-06-04 ENCOUNTER — Other Ambulatory Visit (INDEPENDENT_AMBULATORY_CARE_PROVIDER_SITE_OTHER): Payer: Self-pay | Admitting: Family Medicine

## 2023-08-12 ENCOUNTER — Other Ambulatory Visit (INDEPENDENT_AMBULATORY_CARE_PROVIDER_SITE_OTHER): Payer: Self-pay | Admitting: Family Medicine

## 2023-08-27 ENCOUNTER — Encounter (INDEPENDENT_AMBULATORY_CARE_PROVIDER_SITE_OTHER): Payer: Self-pay | Admitting: Family Medicine

## 2023-09-09 ENCOUNTER — Other Ambulatory Visit: Payer: Self-pay

## 2023-09-09 ENCOUNTER — Other Ambulatory Visit (HOSPITAL_COMMUNITY): Payer: Self-pay | Admitting: Pulmonary Disease

## 2023-09-09 ENCOUNTER — Ambulatory Visit
Admission: RE | Admit: 2023-09-09 | Discharge: 2023-09-09 | Disposition: A | Discharge status: Home / Self Care | Payer: Medicare Other | Source: Ambulatory Visit | Attending: Pulmonary Disease

## 2023-09-09 DIAGNOSIS — R0609 Other forms of dyspnea: Secondary | ICD-10-CM | POA: Insufficient documentation

## 2023-09-09 DIAGNOSIS — J479 Bronchiectasis, uncomplicated: Secondary | ICD-10-CM

## 2023-09-09 DIAGNOSIS — J9811 Atelectasis: Secondary | ICD-10-CM

## 2023-09-30 ENCOUNTER — Other Ambulatory Visit (INDEPENDENT_AMBULATORY_CARE_PROVIDER_SITE_OTHER): Payer: Self-pay | Admitting: Family Medicine

## 2023-09-30 DIAGNOSIS — J449 Chronic obstructive pulmonary disease, unspecified: Secondary | ICD-10-CM

## 2023-10-29 ENCOUNTER — Other Ambulatory Visit: Attending: Family Medicine | Admitting: Family Medicine

## 2023-10-29 ENCOUNTER — Other Ambulatory Visit: Payer: Self-pay

## 2023-10-29 ENCOUNTER — Ambulatory Visit (INDEPENDENT_AMBULATORY_CARE_PROVIDER_SITE_OTHER)

## 2023-10-29 DIAGNOSIS — I10 Essential (primary) hypertension: Secondary | ICD-10-CM | POA: Insufficient documentation

## 2023-10-29 LAB — BASIC METABOLIC PANEL
ANION GAP: 8 mmol/L (ref 4–13)
BUN/CREA RATIO: 16 (ref 6–22)
BUN: 13 mg/dL (ref 7–25)
CALCIUM: 9.6 mg/dL (ref 8.6–10.3)
CHLORIDE: 104 mmol/L (ref 98–107)
CO2 TOTAL: 27 mmol/L (ref 21–31)
CREATININE: 0.8 mg/dL (ref 0.60–1.30)
ESTIMATED GFR: 74 mL/min/{1.73_m2} (ref >59)
GLUCOSE: 93 mg/dL (ref 74–109)
OSMOLALITY, CALCULATED: 277 mosm/kg (ref 270–290)
POTASSIUM: 4.4 mmol/L (ref 3.5–5.1)
SODIUM: 139 mmol/L (ref 136–145)

## 2023-10-29 LAB — HEPATIC FUNCTION PANEL
ALBUMIN/GLOBULIN RATIO: 1.3 (ref 0.8–1.4)
ALBUMIN: 4.5 g/dL (ref 3.5–5.7)
ALKALINE PHOSPHATASE: 58 U/L (ref 34–104)
ALT (SGPT): 16 U/L (ref 7–52)
AST (SGOT): 26 U/L (ref 13–39)
BILIRUBIN DIRECT: 0.16 md/dL (ref 0.03–0.18)
BILIRUBIN TOTAL: 0.8 mg/dL (ref 0.3–1.0)
BILIRUBIN, INDIRECT: 0.64 mg/dL (ref <=1)
GLOBULIN: 3.4 (ref 2.0–3.5)
PROTEIN TOTAL: 7.9 g/dL (ref 6.4–8.9)

## 2023-10-29 LAB — URINALYSIS, MICROSCOPIC
HYALINE CASTS: 7 /LPF — ABNORMAL HIGH (ref <0)
RBCS: 4 /HPF — ABNORMAL HIGH (ref <4)
SQUAMOUS EPITHELIAL: 3 /HPF (ref <28)
WBCS: 22 /HPF — ABNORMAL HIGH (ref <6)

## 2023-10-29 LAB — URINALYSIS, MACROSCOPIC
BILIRUBIN: NEGATIVE mg/dL
BLOOD: NEGATIVE mg/dL
GLUCOSE: NEGATIVE mg/dL
KETONES: NEGATIVE mg/dL
LEUKOCYTES: 500 WBCs/uL — AB
NITRITE: NEGATIVE
PH: 5.5 (ref 5.0–9.0)
PROTEIN: NEGATIVE mg/dL
SPECIFIC GRAVITY: 1.017 (ref 1.002–1.030)
UROBILINOGEN: NORMAL mg/dL

## 2023-10-29 LAB — LIPID PANEL
CHOL/HDL RATIO: 2.2
CHOLESTEROL: 201 mg/dL — ABNORMAL HIGH (ref <200)
HDL CHOL: 91 mg/dL (ref >=40)
LDL CALC: 87 mg/dL (ref 0–100)
TRIGLYCERIDES: 115 mg/dL (ref <=150)
VLDL CALC: 23 mg/dL (ref 0–50)

## 2023-10-29 LAB — CBC WITH DIFF
BASOPHIL #: 0.1 10*3/uL (ref 0.00–0.10)
BASOPHIL %: 1 % (ref 0–1)
EOSINOPHIL #: 0.2 10*3/uL (ref 0.00–0.50)
EOSINOPHIL %: 3 % (ref 1–7)
HCT: 45.5 % — ABNORMAL HIGH (ref 31.2–41.9)
HGB: 15.6 g/dL — ABNORMAL HIGH (ref 10.9–14.3)
LYMPHOCYTE #: 1.4 10*3/uL (ref 1.10–3.10)
LYMPHOCYTE %: 22 % (ref 16–46)
MCH: 32.5 pg (ref 24.7–32.8)
MCHC: 34.2 g/dL (ref 32.3–35.6)
MCV: 94.9 fL (ref 75.5–95.3)
MONOCYTE #: 0.4 10*3/uL (ref 0.20–0.90)
MONOCYTE %: 7 % (ref 4–11)
MPV: 7.6 fL — ABNORMAL LOW (ref 7.9–10.8)
NEUTROPHIL #: 4.2 10*3/uL (ref 1.90–8.20)
NEUTROPHIL %: 68 % (ref 43–77)
PLATELETS: 241 10*3/uL (ref 140–440)
RBC: 4.79 10*6/uL (ref 3.63–4.92)
RDW: 13.1 % (ref 12.3–17.7)
WBC: 6.3 10*3/uL (ref 3.8–11.8)

## 2023-10-29 LAB — THYROID STIMULATING HORMONE (SENSITIVE TSH): TSH: 3.279 u[IU]/mL (ref 0.450–5.330)

## 2023-10-30 ENCOUNTER — Encounter (INDEPENDENT_AMBULATORY_CARE_PROVIDER_SITE_OTHER): Payer: Medicare Other | Admitting: Family Medicine

## 2023-10-30 ENCOUNTER — Ambulatory Visit (INDEPENDENT_AMBULATORY_CARE_PROVIDER_SITE_OTHER): Admitting: Family Medicine

## 2023-10-30 ENCOUNTER — Encounter (INDEPENDENT_AMBULATORY_CARE_PROVIDER_SITE_OTHER): Payer: Self-pay | Admitting: Family Medicine

## 2023-10-30 VITALS — BP 109/64 | HR 72 | Temp 98.7°F | Resp 20 | Ht 63.0 in | Wt 127.0 lb

## 2023-10-30 DIAGNOSIS — Z1231 Encounter for screening mammogram for malignant neoplasm of breast: Secondary | ICD-10-CM

## 2023-10-30 DIAGNOSIS — Z1211 Encounter for screening for malignant neoplasm of colon: Secondary | ICD-10-CM

## 2023-10-30 DIAGNOSIS — I700 Atherosclerosis of aorta: Secondary | ICD-10-CM

## 2023-10-30 DIAGNOSIS — I10 Essential (primary) hypertension: Secondary | ICD-10-CM

## 2023-10-30 DIAGNOSIS — F320 Major depressive disorder, single episode, mild: Secondary | ICD-10-CM

## 2023-10-30 DIAGNOSIS — I471 Supraventricular tachycardia, unspecified: Secondary | ICD-10-CM

## 2023-10-30 DIAGNOSIS — J449 Chronic obstructive pulmonary disease, unspecified: Secondary | ICD-10-CM

## 2023-10-30 DIAGNOSIS — F411 Generalized anxiety disorder: Secondary | ICD-10-CM

## 2023-10-30 DIAGNOSIS — E782 Mixed hyperlipidemia: Secondary | ICD-10-CM

## 2023-10-30 NOTE — Progress Notes (Signed)
 FAMILY MEDICINE, MEDICAL OFFICE BUILDING  [address]  [place] [address]       Name: Veronica Willis MRN:  [id number]   Date: 10/30/2023 Age: 83 y.o.          Provider: Jamison Mccreedy, DO    Reason for visit: Follow Up 3 Months      History of Present Illness:  10/30/2023:  This 83 year old female returns for 3 month follow-up to review her labs and get refills on all her medications.  She has lost 1 lb since she was here last.  She is going to the fitness center 3 times a week going with silver sneakers in his she saw Dr. Arbutus Knoll is office and was given a Breo Ellipta  plus nebulizers she does want to do a Cologuard colon cancer screening test she does take stool softener he has not seen any blood in his stool.  Her hemoglobin was 15.6 hematocrit 45.5 electrolytes were completely normal with a glucose of 93 BUN 13 creatinine 0.80 globulin total was 3.4 total cholesterol a little high at 201 HDL 91 LDL 87 triglycerides 115 TSH 3.279 liver enzymes were normal we did not do a magnesium  level will check the urine and had 500 leukocytes with 22 WBCs on microscopic and 4 red blood cells with the patient did not collect the specimen properly.  So far that has not been any growth on her urine.  At this time she denies nausea vomiting diarrhea constipation syncope or near-syncope.  Historical Data    Past Medical History:  Past Medical History:   Diagnosis Date    Anxiety     Cardiac arrhythmia     Chronic obstructive airway disease     Hypercholesterolemia     Hypertension     Hypomagnesemia     Intertrochanteric fracture of left femur     Vitamin D deficiency          Past Surgical History:  Past Surgical History:   Procedure Laterality Date    BRONCHOSCOPY      HX CARPAL TUNNEL RELEASE Bilateral     HX CYSTOCELE REPAIR      HX HIP REPLACEMENT Left     HX HIP REPLACEMENT Left          Allergies:  Allergies   Allergen Reactions    Penicillins Nausea/ Vomiting    Sulfa (Sulfonamides) Nausea/ Vomiting      Medications:  Current Outpatient Medications   Medication Sig    albuterol  sulfate (PROVENTIL ) 2.5 mg /3 mL (0.083 %) Inhalation nebulizer solution USE ONE vial in NEBULIZER THREE TIMES DAILY AS NEEDED    BREO ELLIPTA  100-25 mcg/dose Inhalation Disk with Device Take 1 Inhalation by inhalation Daily    busPIRone  (BUSPAR ) 5 mg Oral Tablet TAKE ONE TABLET BY MOUTH THREE TIMES DAILY    dilTIAZem (CARDIZEM) 60 mg Oral Tablet Take 1 Tablet (60 mg total) by mouth Once per day as needed    ergocalciferol, vitamin D2, (DRISDOL) 1,250 mcg (50,000 unit) Oral Capsule TAKE ONE CAPSULE BY MOUTH once weekly    fluorometholone (FML LIQUIFILM) 0.1 % Ophthalmic Drops, Suspension Instill 1 Drop into both eyes Twice daily    ipratropium-albuterol  0.5 mg-3 mg(2.5 mg base)/3 mL Solution for Nebulization Take 3 mL by nebulization Every 4 hours as needed for up to 180 days    losartan  (COZAAR ) 100 mg Oral Tablet Take 1 Tablet (100 mg total) by mouth Once a day for 180 days  magnesium  oxide 400 mg Oral Tablet Take 1 Tablet (400 mg total) by mouth Twice daily    metoprolol  tartrate (LOPRESSOR ) 25 mg Oral Tablet Take 1 Tablet (25 mg total) by mouth Three times a day    RESTASIS 0.05 % Ophthalmic Dropperette Instill 1 Drop into both eyes Every 12 hours    sertraline  (ZOLOFT ) 100 mg Oral Tablet Take 1 Tablet (100 mg total) by mouth Once a day for 180 days    simvastatin  (ZOCOR ) 40 mg Oral Tablet Take 1 Tablet (40 mg total) by mouth Every evening for 180 days     Family History:  Family Medical History:       Problem Relation (Age of Onset)    Congestive Heart Failure Mother    Diabetes type II Sister    Prostate Cancer Father    Stroke Sister            Social History:  Social History     Socioeconomic History    Marital status: Widowed   Tobacco Use    Smoking status: Never     Passive exposure: Past (husband was a smoker)    Smokeless tobacco: Never   Vaping Use    Vaping status: Never Used   Substance and Sexual Activity    Alcohol  use: Yes     Comment: SOCIAL occasional wine    Drug use: Never           Review of Systems:  Any pertinent Review of Systems as addressed in the HPI above.    Physical Exam:  Vital Signs:  Vitals:    10/30/23 1525   BP: 109/64   Pulse: 72   Resp: 20   Temp: 37.1 C (98.7 F)   TempSrc: Temporal   SpO2: 96%   Weight: 57.6 kg (127 lb)   Height: 1.6 m (5\' 3" )   BMI: 22.5     Physical Exam  Vitals and nursing note reviewed.   Constitutional:       General: She is awake.      Appearance: Normal appearance. She is well-developed, well-groomed and normal weight.   HENT:      Head: Normocephalic and atraumatic.      Right Ear: Tympanic membrane, ear canal and external ear normal.      Left Ear: Tympanic membrane, ear canal and external ear normal.      Nose: Nose normal.      Mouth/Throat:      Mouth: Mucous membranes are moist.      Pharynx: Oropharynx is clear.   Eyes:      Extraocular Movements: Extraocular movements intact.      Conjunctiva/sclera: Conjunctivae normal.      Pupils: Pupils are equal, round, and reactive to light.   Cardiovascular:      Rate and Rhythm: Normal rate and regular rhythm.      Pulses:           Carotid pulses are 2+ on the right side and 2+ on the left side.       Radial pulses are 2+ on the right side and 2+ on the left side.        Dorsalis pedis pulses are 2+ on the right side and 2+ on the left side.        Posterior tibial pulses are 2+ on the right side and 2+ on the left side.      Heart sounds: Normal heart sounds, S1 normal and S2 normal.  Pulmonary:      Effort: Pulmonary effort is normal.      Breath sounds: Normal breath sounds.   Abdominal:      General: Abdomen is flat. Bowel sounds are normal.      Palpations: Abdomen is soft.      Tenderness: There is no abdominal tenderness. There is no right CVA tenderness or left CVA tenderness.   Musculoskeletal:         General: Normal range of motion.      Cervical back: Normal range of motion and neck supple.      Right lower leg: No  edema.      Left lower leg: No edema.   Skin:     General: Skin is warm and dry.      Capillary Refill: Capillary refill takes less than 2 seconds.   Neurological:      General: No focal deficit present.      Mental Status: She is alert and oriented to person, place, and time. Mental status is at baseline.      Cranial Nerves: Cranial nerves 2-12 are intact.      Sensory: Sensation is intact.      Motor: Motor function is intact.      Coordination: Coordination is intact.      Gait: Gait is intact.   Psychiatric:         Mood and Affect: Mood normal.         Behavior: Behavior normal. Behavior is cooperative.         Thought Content: Thought content normal.         Judgment: Judgment normal.       Assessment:    ICD-10-CM    1. 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 Atherosclerosis of aorta (CMS HCC)  I70.0       3. SVT (supraventricular tachycardia) (CMS HCC)  I47.10       4. Chronic obstructive pulmonary disease, unspecified COPD type (CMS HCC)  J44.9       5. Breast cancer screening by mammogram  Z12.31 MAMMO BILATERAL SCREENING      6. Essential hypertension  I10 CBC/DIFF     BASIC METABOLIC PANEL     URINALYSIS, MACROSCOPIC AND MICROSCOPIC W/CULTURE REFLEX     LIPID PANEL     HEPATIC FUNCTION PANEL     THYROID  STIMULATING HORMONE (SENSITIVE TSH)     MAGNESIUM       7. Hyperlipidemia, mixed  E78.2       8. GAD (generalized anxiety disorder)  F41.1       9. Screening for colon cancer  Z12.11 Cologuard colon cancer screening         Plan:  Orders Placed This Encounter    MAMMO BILATERAL SCREENING    CBC/DIFF    BASIC METABOLIC PANEL    URINALYSIS, MACROSCOPIC AND MICROSCOPIC W/CULTURE REFLEX    LIPID PANEL    HEPATIC FUNCTION PANEL    THYROID  STIMULATING HORMONE (SENSITIVE TSH)    MAGNESIUM     Cologuard colon cancer screening     Today I ordered a mammogram and a Cologuard.  We also for 3 months from now ordered a CBC with diff basic metabolic panel urinalysis macroscopic and microscopic with the culture  reflex lipid panel hepatic function panel thyroid -stimulating hormone.  Also ordered a magnesium  level.  She will stay on her BuSpar  and sertraline  for mild major depression.  She will continue the  beta-blocker and calcium channel blocker for SVT and hypertension.  She will continue the [facility] for her chronic obstructive pulmonary disease.  She will continue to stay on a heart healthy low-fat low-cholesterol diet and avoid high fructose corn syrup and concentrated sweets.  She states her husband died from metastatic colon cancer.  She will continue to exercise 3 times a week and continue her stool softener.  More than 50% of the visit was spent counseling and coordinating patient care.  All questions were answered to the satisfaction of the patient    Return in about 3 months (around 01/29/2024).    Jamison Mccreedy, DO     Portions of this note may be dictated using voice recognition software or a dictation service. Variances in spelling and vocabulary are possible and unintentional. Not all errors are caught/corrected. Please notify the [facility] if any discrepancies are noted or if the meaning of any statement is not clear.

## 2023-10-30 NOTE — Nursing Note (Signed)
 10/30/23 1521   Depression Screen   Little interest or pleasure in doing things. 0   Feeling down, depressed, or hopeless 0   PHQ 2 Total 0

## 2023-10-30 NOTE — Nursing Note (Signed)
 10/30/23 1519   Recent Weight Change   Have you had a recent unexplained weight loss or gain? N   Health Education and Literacy   How often do you have a problem understanding what is told to you about your medical condition?  Never   Domestic Violence   Because we are aware of abuse and domestic violence today, we ask all patients: Are you being hurt, hit, or frightened by anyone at your home or in your life?  N   Basic Needs   Do you have any basic needs within your home that are not being met? (such as Food, Shelter, [employer] fast streamer, Tranportation, paying for bills and/or medications) N   Advanced Directives   Do you have any advanced directives? Living Will & MPOA   Do you have the Advanced Directive(s) so we can scan them to your chart? Veronica Willis

## 2023-10-30 NOTE — Nursing Note (Signed)
 10/30/23 1520   Fall Risk Assessment   Do you feel unsteady when standing or walking? Yes  (sometimes she uses a cane)   Do you worry about falling? Yes   Have you fallen in the past year? No

## 2023-10-31 ENCOUNTER — Encounter (INDEPENDENT_AMBULATORY_CARE_PROVIDER_SITE_OTHER): Admitting: Family Medicine

## 2023-10-31 LAB — URINE CULTURE,ROUTINE: URINE CULTURE: 10000 — AB

## 2023-11-18 LAB — COLOGUARD® COLON CANCER SCREEN: COLOGUARD RESULT: NEGATIVE

## 2023-11-19 ENCOUNTER — Ambulatory Visit (INDEPENDENT_AMBULATORY_CARE_PROVIDER_SITE_OTHER): Payer: Self-pay

## 2023-11-19 ENCOUNTER — Encounter (INDEPENDENT_AMBULATORY_CARE_PROVIDER_SITE_OTHER): Payer: Self-pay | Admitting: Family Medicine

## 2023-11-26 ENCOUNTER — Other Ambulatory Visit (INDEPENDENT_AMBULATORY_CARE_PROVIDER_SITE_OTHER): Payer: Self-pay | Admitting: Family Medicine

## 2023-12-23 ENCOUNTER — Ambulatory Visit (HOSPITAL_COMMUNITY): Payer: Self-pay

## 2023-12-25 ENCOUNTER — Ambulatory Visit (HOSPITAL_COMMUNITY)

## 2023-12-31 ENCOUNTER — Ambulatory Visit
Admission: RE | Admit: 2023-12-31 | Discharge: 2023-12-31 | Disposition: A | Discharge status: Home / Self Care | Source: Ambulatory Visit | Attending: Family Medicine | Admitting: Family Medicine

## 2023-12-31 ENCOUNTER — Other Ambulatory Visit: Payer: Self-pay

## 2023-12-31 ENCOUNTER — Encounter (HOSPITAL_COMMUNITY): Payer: Self-pay

## 2023-12-31 DIAGNOSIS — Z1231 Encounter for screening mammogram for malignant neoplasm of breast: Secondary | ICD-10-CM | POA: Insufficient documentation

## 2024-01-01 ENCOUNTER — Encounter (INDEPENDENT_AMBULATORY_CARE_PROVIDER_SITE_OTHER): Payer: Self-pay | Admitting: Family Medicine

## 2024-01-01 ENCOUNTER — Ambulatory Visit (INDEPENDENT_AMBULATORY_CARE_PROVIDER_SITE_OTHER): Payer: Self-pay

## 2024-01-14 ENCOUNTER — Other Ambulatory Visit (INDEPENDENT_AMBULATORY_CARE_PROVIDER_SITE_OTHER): Payer: Self-pay | Admitting: Family Medicine

## 2024-02-05 ENCOUNTER — Other Ambulatory Visit: Payer: Self-pay

## 2024-02-05 ENCOUNTER — Ambulatory Visit: Payer: Self-pay | Attending: Family Medicine | Admitting: Family Medicine

## 2024-02-05 ENCOUNTER — Encounter (INDEPENDENT_AMBULATORY_CARE_PROVIDER_SITE_OTHER): Payer: Self-pay | Admitting: Family Medicine

## 2024-02-05 ENCOUNTER — Ambulatory Visit (INDEPENDENT_AMBULATORY_CARE_PROVIDER_SITE_OTHER): Admitting: Family Medicine

## 2024-02-05 VITALS — BP 136/78 | HR 71 | Temp 98.5°F | Resp 19 | Ht 62.6 in | Wt 127.0 lb

## 2024-02-05 DIAGNOSIS — F320 Major depressive disorder, single episode, mild: Secondary | ICD-10-CM

## 2024-02-05 DIAGNOSIS — I10 Essential (primary) hypertension: Secondary | ICD-10-CM | POA: Insufficient documentation

## 2024-02-05 DIAGNOSIS — I700 Atherosclerosis of aorta: Secondary | ICD-10-CM

## 2024-02-05 DIAGNOSIS — E782 Mixed hyperlipidemia: Secondary | ICD-10-CM | POA: Insufficient documentation

## 2024-02-05 DIAGNOSIS — J449 Chronic obstructive pulmonary disease, unspecified: Secondary | ICD-10-CM | POA: Insufficient documentation

## 2024-02-05 DIAGNOSIS — F411 Generalized anxiety disorder: Secondary | ICD-10-CM

## 2024-02-05 DIAGNOSIS — I471 Supraventricular tachycardia, unspecified: Secondary | ICD-10-CM

## 2024-02-05 DIAGNOSIS — Z0000 Encounter for general adult medical examination without abnormal findings: Secondary | ICD-10-CM

## 2024-02-05 LAB — LIPID PANEL
CHOL/HDL RATIO: 2.1
CHOLESTEROL: 185 mg/dL (ref <200)
HDL CHOL: 89 mg/dL (ref >=40)
LDL CALC: 77 mg/dL (ref 0–100)
TRIGLYCERIDES: 93 mg/dL (ref <=150)
VLDL CALC: 19 mg/dL (ref 0–50)

## 2024-02-05 LAB — HEPATIC FUNCTION PANEL
ALBUMIN/GLOBULIN RATIO: 1.3 (ref 0.8–1.4)
ALBUMIN: 4.3 g/dL (ref 3.5–5.7)
ALKALINE PHOSPHATASE: 49 U/L (ref 34–104)
ALT (SGPT): 15 U/L (ref 7–52)
AST (SGOT): 22 U/L (ref 13–39)
BILIRUBIN DIRECT: 0.09 md/dL (ref 0.03–0.18)
BILIRUBIN TOTAL: 0.5 mg/dL (ref 0.3–1.0)
BILIRUBIN, INDIRECT: 0.41 mg/dL (ref <=1)
GLOBULIN: 3.2 (ref 2.0–3.5)
PROTEIN TOTAL: 7.5 g/dL (ref 6.4–8.9)

## 2024-02-05 LAB — CBC WITH DIFF
BASOPHIL #: 0 x10ˆ3/uL (ref 0.00–0.10)
BASOPHIL %: 0 % (ref 0–1)
EOSINOPHIL #: 0.1 x10ˆ3/uL (ref 0.00–0.50)
EOSINOPHIL %: 2 % (ref 1–7)
HCT: 43 % — ABNORMAL HIGH (ref 31.2–41.9)
HGB: 14.9 g/dL — ABNORMAL HIGH (ref 10.9–14.3)
LYMPHOCYTE #: 1.3 x10ˆ3/uL (ref 1.10–3.10)
LYMPHOCYTE %: 19 % (ref 16–46)
MCH: 33.3 pg — ABNORMAL HIGH (ref 24.7–32.8)
MCHC: 34.6 g/dL (ref 32.3–35.6)
MCV: 96.3 fL — ABNORMAL HIGH (ref 75.5–95.3)
MONOCYTE #: 0.4 x10ˆ3/uL (ref 0.20–0.90)
MONOCYTE %: 6 % (ref 4–11)
MPV: 7.9 fL (ref 7.9–10.8)
NEUTROPHIL #: 4.8 x10ˆ3/uL (ref 1.90–8.20)
NEUTROPHIL %: 72 % (ref 43–77)
PLATELETS: 207 x10ˆ3/uL (ref 140–440)
RBC: 4.46 x10ˆ6/uL (ref 3.63–4.92)
RDW: 12.7 % (ref 12.3–17.7)
WBC: 6.6 x10ˆ3/uL (ref 3.8–11.8)

## 2024-02-05 LAB — URINALYSIS, MACROSCOPIC
BILIRUBIN: NEGATIVE mg/dL
BLOOD: NEGATIVE mg/dL
GLUCOSE: NEGATIVE mg/dL
KETONES: NEGATIVE mg/dL
LEUKOCYTES: 500 WBCs/uL — AB
NITRITE: NEGATIVE
PH: 5.5 (ref 5.0–9.0)
PROTEIN: NEGATIVE mg/dL
SPECIFIC GRAVITY: 1.015 (ref 1.002–1.030)
UROBILINOGEN: NORMAL mg/dL

## 2024-02-05 LAB — MAGNESIUM: MAGNESIUM: 1.7 mg/dL — ABNORMAL LOW (ref 1.9–2.7)

## 2024-02-05 LAB — BASIC METABOLIC PANEL
ANION GAP: 7 mmol/L (ref 4–13)
BUN/CREA RATIO: 19 (ref 6–22)
BUN: 15 mg/dL (ref 7–25)
CALCIUM: 9.3 mg/dL (ref 8.6–10.3)
CHLORIDE: 106 mmol/L (ref 98–107)
CO2 TOTAL: 24 mmol/L (ref 21–31)
CREATININE: 0.78 mg/dL (ref 0.60–1.30)
ESTIMATED GFR: 76 mL/min/1.73mˆ2 (ref >59)
GLUCOSE: 106 mg/dL (ref 74–109)
OSMOLALITY, CALCULATED: 275 mosm/kg (ref 270–290)
POTASSIUM: 4.2 mmol/L (ref 3.5–5.1)
SODIUM: 137 mmol/L (ref 136–145)

## 2024-02-05 LAB — THYROID STIMULATING HORMONE (SENSITIVE TSH): TSH: 2.008 u[IU]/mL (ref 0.450–5.330)

## 2024-02-05 LAB — URINALYSIS, MICROSCOPIC
RBCS: 4 /HPF — ABNORMAL HIGH (ref <4)
SQUAMOUS EPITHELIAL: 7 /HPF (ref <28)
WBCS: 6 /HPF — ABNORMAL HIGH (ref <6)

## 2024-02-05 NOTE — Nursing Note (Signed)
 02/05/24 0959   Medicare Wellness Assessment   Medicare initial or wellness physical in the last year? No   Advance Directives   Does patient have a living will or MPOA Yes   Has patient provided [employer] with a copy? Yes   Activities of Daily Living   Do you need help with dressing, bathing, or walking? No   Do you need help with shopping, housekeeping, medications, or finances? Yes  (MEDICATIONS IN BLISTER PACK)   Do you have rugs in hallways, broken steps, or poor lighting? No   Do you have grab bars in your bathroom, non-slip strips in your tub, and hand rails on your stairs? Yes   Cognitive Function Screen   What is you age? 1  (3)   What is the time to the nearest hour? 1  (10)   What is the year? 1  (2025)   What is the name of this clinic? 1  [facility])   Can the patient recognize two persons (the doctor, the nurse, home help, etc.)? 1   What is the date of your birth? (day and month sufficient)  1  (October 21, 2040)   In what year did World War II end? 0  (1942)   Who is the current president of the [place] ? 1  (TRUMP)   Count from 20 down to 1? 1   What address did I give you earlier? 1  ([address])   Total Score 9   Interpretation of Total Score Greater than 6 Normal   Depression Screen   Little interest or pleasure in doing things. 0   Feeling down, depressed, or hopeless 0   PHQ 2 Total 0   Pain Score   Pain Score Zero   Substance Use Screening   In Past 12 MONTHS, how often have you used any tobacco product (for example, cigarettes, e-cigarettes, cigars, pipes, or smokeless tobacco)? Never   In the PAST 12 MONTHS, how often have you had 5 (men)/4 (women) or more drinks containing alcohol in one day? Never   In the PAST 12 months, how often have you used any prescription medications just for the feeling, more than prescribed, or that were not prescribed for you? Prescriptions may include: opioids, benzodiazepines, medications for ADHD Never   In the PAST 12 MONTHS, how often have  you used any drugs, including marijuana, cocaine or crack, heroin, methamphetamine, hallucinogens, ecstasy/MDMA? Never   Fall Risk Assessment   Do you feel unsteady when standing or walking? Yes  (SOMETIMES)   Do you worry about falling? Yes   Have you fallen in the past year? No   Urinary Incontinence Screen   Do you ever leak urine when you don't want to? No

## 2024-02-05 NOTE — Nursing Note (Signed)
 02/05/24 0949   PHQ 9 (follow up)   Little interest or pleasure in doing things. 0   Feeling down, depressed, or hopeless 0

## 2024-02-05 NOTE — Progress Notes (Signed)
 FAMILY MEDICINE, MEDICAL OFFICE BUILDING  [address]  [place] [address]  Operated by [facility]  Medicare Annual Wellness Visit    Name: Veronica Willis MRN:  [id number]   Date: 02/05/2024 Age: 83 y.o.       SUBJECTIVE:   Veronica Willis is a 83 y.o. female for presenting for Medicare Wellness exam.   I have reviewed and reconciled the medication list with the patient today.        02/05/2024     9:53 AM 02/19/2023    11:52 AM   Comprehensive Health Assessment-Adult   Do you wish to complete this form? Yes Yes   During the past 4 weeks, how would you rate your health in general? Good Fair   During the past 4 weeks, how much difficulty have you had doing your usual activities inside and outside your home because of medical or emotional problems? A little bit of difficulty A little bit of difficulty   During the past 4 weeks, was someone available to help you if you needed and wanted help? Yes, as much as I wanted Yes, as much as I wanted   In the past year, how many times have you gone to the emergency department or been admitted to a hospital for a health problem? None 2-4 times   Are you generally satisfied with your sleep? Yes Yes   Do you have enough money to buy things you need in everyday life, such as food, clothing, medicines, and housing? Yes, always Yes, always   Can you get to places beyond walking distance without help?  (For example, can you drive your own car or travel alone on buses)? Yes       still drives her own car Yes   Do you fasten your seatbelt when you are in a car? Yes, usually Yes, usually       silver sneakers and water  aerobics at [facility]   Do you exercise 20 minutes 3 or more days per week (such as walking, dancing, biking, mowing grass, swimming)? Yes, most of the time       silver snickers at fitness center Yes, some of the time   How often do you eat food that is healthy (fruits, vegetables, lean meats) instead of unhealthy (sweets, fast food, junk food, fatty  foods)? Almost always Most of the time   Have your parents, brothers or sisters had any of the following problems before the age of 80? (check all that apply) Other family illness;High cholesterol       fibrocystic breast disease in sister, HTN in mother    How often do you have trouble taking medicines the eay you are told to take them? I always take them as prescribed I always take them as prescribed   Do you need any help communicating with your doctors and nurses because of vision or hearing problems? No No   During the past 12 months, have you experienced confusion or memory loss that is happening more often or is getting worse? No No   Do you have one person you think of as your personal doctor (primary care provider or family doctor)? Yes Yes       Dr. Layman Law   If you are seeing a Primary Care Provider (PCP) or family doctor. please list their name DR. Billi Bright Dr. Layman Law   Are you now also seeing any specialist physician(s) (such as eye doctor, foot doctor, skin  doctor)? Yes Yes   If you are seeing a specialist for anything such as foot, eye, skin, etc.  please list their name(s) CARDIOLOGY, PULMONOLOGY, NEUROLOGIST. Dr. CANDIE Galloway, Dr. ALONSO Blanch, Dr. Stuart   How confident are you that you can control or manage most of your health problems? Very confident Somewhat confident       I have reviewed and updated as appropriate the past medical, family and social history. 02/05/2024 as summarized below:  Past Medical History:   Diagnosis Date    Anxiety     Cardiac arrhythmia     Chronic obstructive airway disease     Hypercholesterolemia     Hypertension     Hypomagnesemia     Intertrochanteric fracture of left femur     Vitamin D deficiency      Past Surgical History:   Procedure Laterality Date    Bronchoscopy      Hx carpal tunnel release Bilateral     Hx cystocele repair      Hx hip replacement Left     Hx hip replacement Left      Current Outpatient Medications   Medication Sig    albuterol   sulfate (PROVENTIL ) 2.5 mg /3 mL (0.083 %) Inhalation nebulizer solution USE ONE vial in NEBULIZER THREE TIMES DAILY AS NEEDED    BREO ELLIPTA  100-25 mcg/dose Inhalation Disk with Device Take 1 Inhalation by inhalation Daily    busPIRone  (BUSPAR ) 5 mg Oral Tablet TAKE ONE TABLET BY MOUTH THREE TIMES DAILY    dilTIAZem (CARDIZEM) 60 mg Oral Tablet Take 1 Tablet (60 mg total) by mouth Once per day as needed    ergocalciferol, vitamin D2, (DRISDOL) 1,250 mcg (50,000 unit) Oral Capsule TAKE ONE CAPSULE BY MOUTH once weekly    fluorometholone (FML LIQUIFILM) 0.1 % Ophthalmic Drops, Suspension Instill 1 Drop into both eyes Twice daily    ipratropium-albuterol  0.5 mg-3 mg(2.5 mg base)/3 mL Solution for Nebulization Take 3 mL by nebulization Every 4 hours as needed for up to 180 days    losartan  (COZAAR ) 100 mg Oral Tablet TAKE ONE TABLET BY MOUTH ONCE DAILY    magnesium  oxide 400 mg Oral Tablet Take 1 Tablet (400 mg total) by mouth Twice daily    metoprolol  tartrate (LOPRESSOR ) 25 mg Oral Tablet Take 1 Tablet (25 mg total) by mouth Three times a day    RESTASIS 0.05 % Ophthalmic Dropperette Instill 1 Drop into both eyes Every 12 hours    sertraline  (ZOLOFT ) 100 mg Oral Tablet TAKE ONE TABLET BY MOUTH ONCE DAILY    simvastatin  (ZOCOR ) 40 mg Oral Tablet TAKE ONE TABLET BY MOUTH IN THE EVENING     Family Medical History:       Problem Relation (Age of Onset)    Congestive Heart Failure Mother    Diabetes type II Sister    Prostate Cancer Father    Stroke Sister            Social History     Socioeconomic History    Marital status: Widowed   Tobacco Use    Smoking status: Never     Passive exposure: Past (husband was a smoker)    Smokeless tobacco: Never   Vaping Use    Vaping status: Never Used   Substance and Sexual Activity    Alcohol use: Yes     Comment: SOCIAL occasional wine    Drug use: Never     Social Determinants of Health  Health Literacy: Low Risk  (10/30/2023)    Health Literacy     SDOH Health Literacy: Never          List of Current Health Care Providers   Care Team       PCP       Name Type Specialty Phone Number    Charmika, Macdonnell, DO Physician FAMILY MEDICINE [phone]              Care Team       No care team found                      Health Maintenance   Topic Date Due    Adult Tdap-Td (1 - Tdap) Never done    Covid-19 Vaccine (7 - 2024-25 season) 03/31/2023    Medicare Annual Wellness Visit  02/19/2024    Influenza Vaccine (1) 03/30/2024    Osteoporosis screening  12/19/2024    Shingles Vaccine  Completed    RSV Adult 60+ or Pregnancy  Completed    Pneumococcal Vaccination, Age 43+  Completed     Medicare Wellness Assessment   Medicare initial or wellness physical in the last year?: No  Advance Directives   Does patient have a living will or MPOA: Yes   Has patient provided [employer] with a copy?: Yes              Activities of Daily Living   Do you need help with dressing, bathing, or walking?: No   Do you need help with shopping, housekeeping, medications, or finances?: Yes (MEDICATIONS IN BLISTER PACK)   Do you have rugs in hallways, broken steps, or poor lighting?: No   Do you have grab bars in your bathroom, non-slip strips in your tub, and hand rails on your stairs?: Yes   Cognitive Function Screen (1=Yes, 0=No)   What is you age?: Correct (83)   What is the time to the nearest hour?: Correct (10)   What is the year?: Correct (2025)   What is the name of this clinic?: Correct [facility])   Can the patient recognize two persons (the doctor, the nurse, home help, etc.)?: Correct   What is the date of your birth? (day and month sufficient) : Correct (2041/04/06)   In what year did World War II end?: Incorrect (1942)   Who is the current president of the [place] ?: Correct (TRUMP)   Count from 20 down to 1?: Correct   What address did I give you earlier?: Correct ([address])   Total Score: 9   Interpretation of Total Score: Greater than 6 Normal   Fall Risk Screen   Do you feel unsteady  when standing or walking?: Yes (SOMETIMES)  Do you worry about falling?: Yes  Have you fallen in the past year?: No   Depression Screen     Little interest or pleasure in doing things.: Not at all  Feeling down, depressed, or hopeless: Not at all  PHQ 2 Total: 0     Pain Score   Pain Score:   0 - No pain    Substance Use-Abuse Screening     Tobacco Use     In Past 12 MONTHS, how often have you used any tobacco product (for example, cigarettes, e-cigarettes, cigars, pipes, or smokeless tobacco)?: Never     Alcohol use     In the PAST 12 MONTHS, how often have you had 5 (men)/4 (women) or  more drinks containing alcohol in one day?: Never     Prescription Drug Use     In the PAST 12 months, how often have you used any prescription medications just for the feeling, more than prescribed, or that were not prescribed for you? Prescriptions may include: opioids, benzodiazepines, medications for ADHD: Never           Illicit Drug Use   In the PAST 12 MONTHS, how often have you used any drugs, including marijuana, cocaine or crack, heroin, methamphetamine, hallucinogens, ecstasy/MDMA?: Never            Urine Incontinence Screen   Urinary Incontinence Screen  Do you ever leak urine when you don't want to?: No                     OBJECTIVE:   BP 136/78 (Site: Left Arm, Patient Position: Sitting, Cuff Size: Adult)   Pulse 71   Temp 36.9 C (98.5 F) (Temporal)   Resp 19   Ht 1.59 m (5' 2.6)   Wt 57.6 kg (127 lb)   SpO2 98%   BMI 22.79 kg/m        Other appropriate exam:    Health Maintenance Due   Topic Date Due    Adult Tdap-Td (1 - Tdap) Never done    Covid-19 Vaccine (7 - 2024-25 season) 03/31/2023      ASSESSMENT & PLAN:  Problem List Items Addressed This Visit          Cardiovascular System    SVT (supraventricular tachycardia) (CMS HCC)    Hyperlipidemia, mixed    Essential hypertension    Atherosclerosis of aorta (CMS HCC) - Primary       Respiratory    Chronic obstructive pulmonary disease, unspecified COPD type  (CMS HCC)       Psychiatric    GAD (generalized anxiety disorder)    Mild major depression (CMS HCC)        Identified Risk Factors/ Recommended Actions     Fall Risk Follow up plan of care: Vitamin D supplementation advised  Discussed optimizing home safety  Manage & monitor hypotension  Medications managed to minimize fall risk  Footwear and potential problems addressed  The PHQ 2 Total: 0 depression screen is interpreted as negative.      I recommended the patient get a Tdap vaccination.  In the meantime she should stay on a heart healthy low-fat low-cholesterol diet and avoid high fructose corn syrup and concentrated sweets.  She should continue to go to the fitness center and exercise on a regular basis.  She should limit salt to no more than 2 g a day.        No orders of the defined types were placed in this encounter.         The patient has been educated about risk factors and recommended preventive care. Written Prevention Plan completed/ updated and given to patient (see After Visit Summary).    Return in about 1 year (around 02/04/2025).    Layman Law, DO

## 2024-02-05 NOTE — Progress Notes (Signed)
 FAMILY MEDICINE, MEDICAL OFFICE BUILDING  [address]  [place] [address]  Operated by [facility]     Name: Veronica Willis MRN:  [id number]   Date: 02/05/2024 Age: 83 y.o.          Provider: Layman Law, DO    Reason for visit: Follow Up 3 Months      History of Present Illness:  02/05/2024:  This 83 year old female returns for 3 month follow-up to review her labs and get refills on all her medications.  Unfortunately she has not had her labs done yet she saw Dr. LULLA Blanch ordered a vibrating vest to wear to loosen up chest congestion.  He has never smoked but her husband smoked she gets her medications he blister packs she still complains about a little left hip pain from history replacement she states it was fractured twice a day uses Motrin and Biofreeze which helps she saw her cardiologist Dr. Shona for routine visit no medication changes were made her vital signs are stable she is afebrile.  She denies nausea vomiting diarrhea constipation syncope near-syncope chest pain or shortness for breath.  Historical Data    Past Medical History:  Past Medical History:   Diagnosis Date    Anxiety     Cardiac arrhythmia     Chronic obstructive airway disease     Hypercholesterolemia     Hypertension     Hypomagnesemia     Intertrochanteric fracture of left femur     Vitamin D deficiency          Past Surgical History:  Past Surgical History:   Procedure Laterality Date    BRONCHOSCOPY      HX CARPAL TUNNEL RELEASE Bilateral     HX CYSTOCELE REPAIR      HX HIP REPLACEMENT Left     HX HIP REPLACEMENT Left          Allergies:  Allergies[1]  Medications:  Current Outpatient Medications   Medication Sig    albuterol  sulfate (PROVENTIL ) 2.5 mg /3 mL (0.083 %) Inhalation nebulizer solution USE ONE vial in NEBULIZER THREE TIMES DAILY AS NEEDED    BREO ELLIPTA  100-25 mcg/dose Inhalation Disk with Device Take 1 Inhalation by inhalation Daily    busPIRone  (BUSPAR ) 5 mg Oral Tablet TAKE ONE TABLET BY MOUTH  THREE TIMES DAILY    dilTIAZem (CARDIZEM) 60 mg Oral Tablet Take 1 Tablet (60 mg total) by mouth Once per day as needed    ergocalciferol, vitamin D2, (DRISDOL) 1,250 mcg (50,000 unit) Oral Capsule TAKE ONE CAPSULE BY MOUTH once weekly    fluorometholone (FML LIQUIFILM) 0.1 % Ophthalmic Drops, Suspension Instill 1 Drop into both eyes Twice daily    ipratropium-albuterol  0.5 mg-3 mg(2.5 mg base)/3 mL Solution for Nebulization Take 3 mL by nebulization Every 4 hours as needed for up to 180 days    losartan  (COZAAR ) 100 mg Oral Tablet TAKE ONE TABLET BY MOUTH ONCE DAILY    magnesium  oxide 400 mg Oral Tablet Take 1 Tablet (400 mg total) by mouth Twice daily    metoprolol  tartrate (LOPRESSOR ) 25 mg Oral Tablet Take 1 Tablet (25 mg total) by mouth Three times a day    RESTASIS 0.05 % Ophthalmic Dropperette Instill 1 Drop into both eyes Every 12 hours    sertraline  (ZOLOFT ) 100 mg Oral Tablet TAKE ONE TABLET BY MOUTH ONCE DAILY    simvastatin  (ZOCOR ) 40 mg Oral Tablet TAKE ONE TABLET BY MOUTH IN THE EVENING  Family History:  Family Medical History:       Problem Relation (Age of Onset)    Congestive Heart Failure Mother    Diabetes type II Sister    Prostate Cancer Father    Stroke Sister            Social History:  Social History     Socioeconomic History    Marital status: Widowed   Tobacco Use    Smoking status: Never     Passive exposure: Past (husband was a smoker)    Smokeless tobacco: Never   Vaping Use    Vaping status: Never Used   Substance and Sexual Activity    Alcohol use: Yes     Comment: SOCIAL occasional wine    Drug use: Never           Review of Systems:  Any pertinent Review of Systems as addressed in the HPI above.    Physical Exam:  Vital Signs:  Vitals:    02/05/24 0949   BP: 136/78   Pulse: 71   Resp: 19   Temp: 36.9 C (98.5 F)   TempSrc: Temporal   SpO2: 98%   Weight: 57.6 kg (127 lb)   Height: 1.59 m (5' 2.6)   BMI: 22.79     Physical Exam  Vitals and nursing note reviewed.   Constitutional:        General: She is awake.      Appearance: Normal appearance. She is well-developed, well-groomed and normal weight.   HENT:      Head: Normocephalic and atraumatic.      Right Ear: Tympanic membrane, ear canal and external ear normal.      Left Ear: Tympanic membrane, ear canal and external ear normal.      Nose: Nose normal.      Mouth/Throat:      Mouth: Mucous membranes are moist.      Pharynx: Oropharynx is clear.   Eyes:      Extraocular Movements: Extraocular movements intact.      Conjunctiva/sclera: Conjunctivae normal.      Pupils: Pupils are equal, round, and reactive to light.   Cardiovascular:      Rate and Rhythm: Normal rate and regular rhythm.      Pulses:           Carotid pulses are 2+ on the right side and 2+ on the left side.       Radial pulses are 2+ on the right side and 2+ on the left side.        Dorsalis pedis pulses are 2+ on the right side and 2+ on the left side.        Posterior tibial pulses are 2+ on the right side and 2+ on the left side.      Heart sounds: Normal heart sounds, S1 normal and S2 normal.   Pulmonary:      Effort: Pulmonary effort is normal.      Breath sounds: Normal breath sounds. No decreased breath sounds, wheezing, rhonchi or rales.   Abdominal:      General: Abdomen is protuberant. Bowel sounds are normal.      Palpations: Abdomen is soft.      Tenderness: There is no abdominal tenderness. There is no right CVA tenderness or left CVA tenderness.   Musculoskeletal:         General: Normal range of motion.      Cervical back: Normal range of motion and  neck supple.      Right lower leg: No edema.      Left lower leg: No edema.   Skin:     General: Skin is warm and dry.      Capillary Refill: Capillary refill takes less than 2 seconds.   Neurological:      General: No focal deficit present.      Mental Status: She is alert and oriented to person, place, and time. Mental status is at baseline.      Cranial Nerves: Cranial nerves 2-12 are intact.      Sensory:  Sensation is intact.      Motor: Motor function is intact.      Coordination: Coordination is intact.      Gait: Gait is intact.   Psychiatric:         Mood and Affect: Mood normal.         Behavior: Behavior normal. Behavior is cooperative.         Thought Content: Thought content normal.         Judgment: Judgment normal.       Assessment:    ICD-10-CM    1. Atherosclerosis of aorta (CMS HCC)  I70.0       2. SVT (supraventricular tachycardia) (CMS HCC)  I47.10       3. Chronic obstructive pulmonary disease, unspecified COPD type (CMS HCC)  J44.9       4. 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 Hyperlipidemia, mixed  E78.2       6. Essential hypertension  I10 CBC/DIFF     BASIC METABOLIC PANEL     URINALYSIS, MACROSCOPIC AND MICROSCOPIC W/CULTURE REFLEX     LIPID PANEL     HEPATIC FUNCTION PANEL     THYROID  STIMULATING HORMONE (SENSITIVE TSH)     MAGNESIUM       7. GAD (generalized anxiety disorder)  F41.1          Plan:  Orders Placed This Encounter    CBC/DIFF    BASIC METABOLIC PANEL    URINALYSIS, MACROSCOPIC AND MICROSCOPIC W/CULTURE REFLEX    LIPID PANEL    HEPATIC FUNCTION PANEL    THYROID  STIMULATING HORMONE (SENSITIVE TSH)    MAGNESIUM      Patient will get her labs done today.  She does not need any medications.  Today for 3 months from now ordered a CBC with diff basic metabolic panel urinalysis lipid panel hepatic function panel thyroid -stimulating hormone and magnesium  level.  I recommend she stay on her regular medications and take supplemental magnesium  glycinate or magnesium  citrate.  For her Chronic Obstructive Pulmonary Disease and asthma she will continue her albuterol  inhaler and Breo Ellipta  as well as her ipratropium bromide and albuterol  nebulization for her hypertension she will continue her Cozaar  100 metoprolol  25 Cardizem 60 and limit salt to no more than 2 g a day for her mixed hyperlipidemia she will continue a low-fat low-cholesterol heart healthy diet and 40 mg of Zocor .  For  her mild major depression she will continue her sertraline  and BuSpar .  I recommend the patient get a Tdap vaccine.    Return in about 3 months (around 05/07/2024).    Layman Law, DO     Portions of this note may be dictated using voice recognition software or a dictation service. Variances in spelling and vocabulary are possible and unintentional. Not all errors are caught/corrected. Please notify the dino if any  discrepancies are noted or if the meaning of any statement is not clear.          [1]   Allergies  Allergen Reactions    Penicillins Nausea/ Vomiting    Sulfa (Sulfonamides) Nausea/ Vomiting

## 2024-02-05 NOTE — Nursing Note (Signed)
 02/05/24 0949   Recent Weight Change   Have you had a recent unexplained weight loss or gain? N   Domestic Violence   Because we are aware of abuse and domestic violence today, we ask all patients: Are you being hurt, hit, or frightened by anyone at your home or in your life?  N   Basic Needs   Do you have any basic needs within your home that are not being met? (such as Food, Shelter, [employer] fast streamer, Tranportation, paying for bills and/or medications) N

## 2024-02-05 NOTE — Nursing Note (Signed)
 02/05/24 0953   Comprehensive Health Assessment-Adult   Do you wish to complete this form? Yes   During the past 4 weeks, how would you rate your health in general? Good   During the past 4 weeks, how much difficulty have you had doing your usual activities inside and outside your home because of medical or emotional problems? A little bit of difficulty   During the past 4 weeks, was someone available to help you if you needed and wanted help? Yes, as much as I wanted   In the past year, how many times have you gone to the emergency department or been admitted to a hospital for a health problem? None   Are you generally satisfied with your sleep? Yes   Do you have enough money to buy things you need in everyday life, such as food, clothing, medicines, and housing? Yes, always   Can you get to places beyond walking distance without help?  (For example, can you drive your own car or travel alone on buses)? Yes  (still drives her own car)   Do you fasten your seatbelt when you are in a car? Yes, usually   Do you exercise 20 minutes 3 or more days per week (such as walking, dancing, biking, mowing grass, swimming)? Yes, most of the time  (silver snickers at fitness center)   How often do you eat food that is healthy (fruits, vegetables, lean meats) instead of unhealthy (sweets, fast food, junk food, fatty foods)? Almost always   Have your parents, brothers or sisters had any of the following problems before the age of 10? (check all that apply) Other family illness;High cholesterol  (fibrocystic breast disease in sister, HTN in mother)   How often do you have trouble taking medicines the eay you are told to take them? I always take them as prescribed   Do you need any help communicating with your doctors and nurses because of vision or hearing problems? No   During the past 12 months, have you experienced confusion or memory loss that is happening more often or is getting worse? No   Do you have one person you think  of as your personal doctor (primary care provider or family doctor)? Yes   If you are seeing a Primary Care Provider (PCP) or family doctor. please list their name DR. MARSHALL LONG   Are you now also seeing any specialist physician(s) (such as eye doctor, foot doctor, skin doctor)? Yes   If you are seeing a specialist for anything such as foot, eye, skin, etc.  please list their name(s) CARDIOLOGY, PULMONOLOGY, NEUROLOGIST.   How confident are you that you can control or manage most of your health problems? Very confident

## 2024-02-07 LAB — URINE CULTURE,ROUTINE: URINE CULTURE: 10000 — AB

## 2024-02-20 ENCOUNTER — Encounter (INDEPENDENT_AMBULATORY_CARE_PROVIDER_SITE_OTHER): Payer: Self-pay | Admitting: Family Medicine

## 2024-05-26 ENCOUNTER — Ambulatory Visit (HOSPITAL_BASED_OUTPATIENT_CLINIC_OR_DEPARTMENT_OTHER): Admission: RE | Admit: 2024-05-26 | Discharge: 2024-05-26 | Disposition: A | Source: Ambulatory Visit

## 2024-05-26 ENCOUNTER — Ambulatory Visit (INDEPENDENT_AMBULATORY_CARE_PROVIDER_SITE_OTHER): Payer: Self-pay

## 2024-05-26 ENCOUNTER — Encounter (INDEPENDENT_AMBULATORY_CARE_PROVIDER_SITE_OTHER): Payer: Self-pay | Admitting: Family Medicine

## 2024-05-26 ENCOUNTER — Other Ambulatory Visit (INDEPENDENT_AMBULATORY_CARE_PROVIDER_SITE_OTHER): Payer: Self-pay | Admitting: Family Medicine

## 2024-05-26 ENCOUNTER — Ambulatory Visit: Payer: Self-pay | Attending: Family Medicine | Admitting: Family Medicine

## 2024-05-26 ENCOUNTER — Other Ambulatory Visit: Payer: Self-pay

## 2024-05-26 VITALS — BP 128/74 | HR 67 | Temp 98.8°F | Resp 16 | Ht 62.6 in | Wt 128.0 lb

## 2024-05-26 DIAGNOSIS — F411 Generalized anxiety disorder: Secondary | ICD-10-CM | POA: Insufficient documentation

## 2024-05-26 DIAGNOSIS — Z23 Encounter for immunization: Secondary | ICD-10-CM | POA: Insufficient documentation

## 2024-05-26 DIAGNOSIS — F320 Major depressive disorder, single episode, mild: Secondary | ICD-10-CM | POA: Insufficient documentation

## 2024-05-26 DIAGNOSIS — M25551 Pain in right hip: Secondary | ICD-10-CM

## 2024-05-26 DIAGNOSIS — J449 Chronic obstructive pulmonary disease, unspecified: Secondary | ICD-10-CM | POA: Insufficient documentation

## 2024-05-26 DIAGNOSIS — I10 Essential (primary) hypertension: Secondary | ICD-10-CM | POA: Insufficient documentation

## 2024-05-26 DIAGNOSIS — I471 Supraventricular tachycardia, unspecified: Secondary | ICD-10-CM | POA: Insufficient documentation

## 2024-05-26 DIAGNOSIS — I700 Atherosclerosis of aorta: Secondary | ICD-10-CM | POA: Insufficient documentation

## 2024-05-26 DIAGNOSIS — M47816 Spondylosis without myelopathy or radiculopathy, lumbar region: Secondary | ICD-10-CM

## 2024-05-26 DIAGNOSIS — E782 Mixed hyperlipidemia: Secondary | ICD-10-CM | POA: Insufficient documentation

## 2024-05-26 LAB — CBC WITH DIFF
BASOPHIL #: 0 x10ˆ3/uL (ref 0.00–0.10)
BASOPHIL %: 1 % (ref 0–1)
EOSINOPHIL #: 0.2 x10ˆ3/uL (ref 0.00–0.50)
EOSINOPHIL %: 3 % (ref 1–7)
HCT: 41 % (ref 31.2–41.9)
HGB: 14.2 g/dL (ref 10.9–14.3)
LYMPHOCYTE #: 1.2 x10ˆ3/uL (ref 1.10–3.10)
LYMPHOCYTE %: 21 % (ref 16–46)
MCH: 33.1 pg — ABNORMAL HIGH (ref 24.7–32.8)
MCHC: 34.7 g/dL (ref 32.3–35.6)
MCV: 95.5 fL — ABNORMAL HIGH (ref 75.5–95.3)
MONOCYTE #: 0.3 x10ˆ3/uL (ref 0.20–0.90)
MONOCYTE %: 6 % (ref 4–11)
MPV: 7.5 fL — ABNORMAL LOW (ref 7.9–10.8)
NEUTROPHIL #: 3.9 x10ˆ3/uL (ref 1.90–8.20)
NEUTROPHIL %: 69 % (ref 43–77)
PLATELETS: 232 x10ˆ3/uL (ref 140–440)
RBC: 4.3 x10ˆ6/uL (ref 3.63–4.92)
RDW: 12.9 % (ref 12.3–17.7)
WBC: 5.6 x10ˆ3/uL (ref 3.8–11.8)

## 2024-05-26 LAB — LIPID PANEL
CHOL/HDL RATIO: 1.9
CHOLESTEROL: 181 mg/dL (ref <200)
HDL CHOL: 97 mg/dL (ref >=40)
LDL CALC: 62 mg/dL (ref 0–100)
TRIGLYCERIDES: 111 mg/dL (ref <=150)
VLDL CALC: 22 mg/dL (ref 0–50)

## 2024-05-26 LAB — URINALYSIS, MACROSCOPIC
BILIRUBIN: NEGATIVE mg/dL
BLOOD: NEGATIVE mg/dL
GLUCOSE: NEGATIVE mg/dL
KETONES: NEGATIVE mg/dL
LEUKOCYTES: 500 WBCs/uL — AB
NITRITE: NEGATIVE
PH: 6 (ref 5.0–9.0)
PROTEIN: NEGATIVE mg/dL
SPECIFIC GRAVITY: 1.019 (ref 1.002–1.030)
UROBILINOGEN: NORMAL mg/dL

## 2024-05-26 LAB — HEPATIC FUNCTION PANEL
ALBUMIN/GLOBULIN RATIO: 1.3 (ref 0.8–1.4)
ALBUMIN: 4.3 g/dL (ref 3.5–5.7)
ALKALINE PHOSPHATASE: 52 U/L (ref 34–104)
ALT (SGPT): 15 U/L (ref 7–52)
AST (SGOT): 24 U/L (ref 13–39)
BILIRUBIN DIRECT: 0.11 md/dL (ref 0.03–0.18)
BILIRUBIN TOTAL: 0.4 mg/dL (ref 0.3–1.0)
BILIRUBIN, INDIRECT: 0.29 mg/dL (ref <=1)
GLOBULIN: 3.3 (ref 2.0–3.5)
PROTEIN TOTAL: 7.6 g/dL (ref 6.4–8.9)

## 2024-05-26 LAB — BASIC METABOLIC PANEL
ANION GAP: 9 mmol/L (ref 4–13)
BUN/CREA RATIO: 24 — ABNORMAL HIGH (ref 6–22)
BUN: 17 mg/dL (ref 7–25)
CALCIUM: 9.1 mg/dL (ref 8.6–10.3)
CHLORIDE: 104 mmol/L (ref 98–107)
CO2 TOTAL: 24 mmol/L (ref 21–31)
CREATININE: 0.72 mg/dL (ref 0.60–1.30)
ESTIMATED GFR: 83 mL/min/1.73mˆ2 (ref >59)
GLUCOSE: 92 mg/dL (ref 74–109)
OSMOLALITY, CALCULATED: 275 mosm/kg (ref 270–290)
POTASSIUM: 4.3 mmol/L (ref 3.5–5.1)
SODIUM: 137 mmol/L (ref 136–145)

## 2024-05-26 LAB — URINALYSIS, MICROSCOPIC

## 2024-05-26 LAB — MAGNESIUM: MAGNESIUM: 2.1 mg/dL (ref 1.9–2.7)

## 2024-05-26 LAB — THYROID STIMULATING HORMONE (SENSITIVE TSH): TSH: 2.469 u[IU]/mL (ref 0.450–5.330)

## 2024-05-26 NOTE — Progress Notes (Signed)
 FAMILY MEDICINE, MEDICAL OFFICE BUILDING  [address]  [place] [address]  Operated by [facility]     Name: Veronica Willis MRN:  [id number]   Date: 05/26/2024 Age: 83 y.o.          Provider: Layman Law, DO    Reason for visit: Follow Up 3 Months and Labs Only      History of Present Illness:  05/26/2024:  This 83 year old female returns for 3 month follow-up to review her labs and get refills on all her medications.  Unfortunately she has not had her labs done yet she states she is doing okay she had a fracture of the left hip several years ago by Dr. Penne she has more pain in his right hip now she uses a cane she has constant pain sharp pain she is using a heating pad and Motrin his hard for her to walk in his she is off balance.  We are going to get an x-ray of the low back in his hip she needs a root canal and will have this done by Dr. Ebb in [place] on 06/09/2024.  She denies nausea vomiting diarrhea constipation syncope near-syncope chest pain more shortness for breath.  Historical Data    Past Medical History:  Past Medical History:   Diagnosis Date    Anxiety     Cardiac arrhythmia     Chronic obstructive airway disease     Hypercholesterolemia     Hypertension     Hypomagnesemia     Intertrochanteric fracture of left femur     Vitamin D deficiency          Past Surgical History:  Past Surgical History:   Procedure Laterality Date    BRONCHOSCOPY      HX CARPAL TUNNEL RELEASE Bilateral     HX CYSTOCELE REPAIR      HX HIP REPLACEMENT Left     HX HIP REPLACEMENT Left          Allergies:  Allergies[1]  Medications:  Current Outpatient Medications   Medication Sig    albuterol  sulfate (PROVENTIL  OR VENTOLIN  OR PROAIR ) 90 mcg/actuation Inhalation oral inhaler 1-2 Puffs Every 4 hours as needed    BREO ELLIPTA  100-25 mcg/dose Inhalation Disk with Device Take 1 Inhalation by inhalation Daily    busPIRone  (BUSPAR ) 5 mg Oral Tablet TAKE ONE TABLET BY MOUTH THREE TIMES DAILY     clindamycin (CLEOCIN) 150 mg Oral Capsule take FOUR capsules ONE hour BEFORE appointment    dilTIAZem (CARDIZEM) 60 mg Oral Tablet Take 1 Tablet (60 mg total) by mouth Once per day as needed    ergocalciferol, vitamin D2, (DRISDOL) 1,250 mcg (50,000 unit) Oral Capsule TAKE ONE CAPSULE BY MOUTH once weekly    fluorometholone (FML LIQUIFILM) 0.1 % Ophthalmic Drops, Suspension Instill 1 Drop into both eyes Twice daily (Patient not taking: Reported on 05/26/2024)    ipratropium-albuterol  0.5 mg-3 mg(2.5 mg base)/3 mL Solution for Nebulization Take 3 mL by nebulization Every 4 hours as needed for up to 180 days    losartan  (COZAAR ) 100 mg Oral Tablet TAKE ONE TABLET BY MOUTH ONCE DAILY    magnesium  oxide 400 mg Oral Tablet Take 1 Tablet (400 mg total) by mouth Twice daily    metoprolol  tartrate (LOPRESSOR ) 25 mg Oral Tablet Take 1 Tablet (25 mg total) by mouth Three times a day    RESTASIS 0.05 % Ophthalmic Dropperette Instill 1 Drop into both eyes Every 12 hours  sertraline  (ZOLOFT ) 100 mg Oral Tablet TAKE ONE TABLET BY MOUTH ONCE DAILY    simvastatin  (ZOCOR ) 40 mg Oral Tablet TAKE ONE TABLET BY MOUTH IN THE EVENING     Family History:  Family Medical History:       Problem Relation (Age of Onset)    Congestive Heart Failure Mother    Diabetes type II Sister    Prostate Cancer Father    Stroke Sister            Social History:  Social History     Socioeconomic History    Marital status: Widowed   Tobacco Use    Smoking status: Never     Passive exposure: Past (husband was a smoker)    Smokeless tobacco: Never   Vaping Use    Vaping status: Never Used   Substance and Sexual Activity    Alcohol use: Yes     Comment: SOCIAL occasional wine    Drug use: Never           Review of Systems:  Any pertinent Review of Systems as addressed in the HPI above.    Physical Exam:  Vital Signs:  Vitals:    05/26/24 0940 05/26/24 1008   BP: (!) 144/90 128/74  Comment: MANUAL BP   Pulse: 67    Resp: 16    Temp: 37.1 C (98.8 F)     TempSrc: Temporal    SpO2: 96%    Weight: 58.1 kg (128 lb)    Height: 1.59 m (5' 2.6)    BMI: 22.96      Physical Exam  Vitals and nursing note reviewed.   Constitutional:       General: She is awake.      Appearance: Normal appearance. She is well-developed, well-groomed and normal weight.   HENT:      Head: Normocephalic and atraumatic.      Right Ear: Tympanic membrane, ear canal and external ear normal.      Left Ear: Tympanic membrane, ear canal and external ear normal.      Nose: Nose normal.      Mouth/Throat:      Mouth: Mucous membranes are moist.      Pharynx: Oropharynx is clear.   Eyes:      Extraocular Movements: Extraocular movements intact.      Conjunctiva/sclera: Conjunctivae normal.      Pupils: Pupils are equal, round, and reactive to light.   Cardiovascular:      Rate and Rhythm: Normal rate and regular rhythm.      Pulses:           Carotid pulses are 2+ on the right side and 2+ on the left side.       Radial pulses are 2+ on the right side and 2+ on the left side.        Dorsalis pedis pulses are 2+ on the right side and 2+ on the left side.        Posterior tibial pulses are 2+ on the right side and 2+ on the left side.      Heart sounds: Normal heart sounds, S1 normal and S2 normal.   Pulmonary:      Effort: Pulmonary effort is normal.      Breath sounds: Normal breath sounds. No decreased breath sounds, wheezing, rhonchi or rales.   Abdominal:      General: Abdomen is flat. Bowel sounds are normal.      Palpations: Abdomen is  soft.      Tenderness: There is no abdominal tenderness. There is no right CVA tenderness or left CVA tenderness.   Musculoskeletal:      Cervical back: Normal range of motion and neck supple.      Lumbar back: Spasms, tenderness and bony tenderness present.      Right hip: Tenderness present. Decreased range of motion. Decreased strength.      Right lower leg: No edema.      Left lower leg: No edema.   Skin:     General: Skin is warm and dry.      Capillary Refill:  Capillary refill takes less than 2 seconds.   Neurological:      General: No focal deficit present.      Mental Status: She is alert and oriented to person, place, and time. Mental status is at baseline.      Cranial Nerves: Cranial nerves 2-12 are intact.      Sensory: Sensation is intact.      Motor: Motor function is intact.      Coordination: Coordination is intact.      Gait: Gait abnormal.      Comments: Walks with a slight limp on the right hip   Psychiatric:         Mood and Affect: Mood normal.         Behavior: Behavior normal. Behavior is cooperative.         Thought Content: Thought content normal.         Judgment: Judgment normal.       Assessment:    ICD-10-CM    1. Atherosclerosis of aorta (CMS HCC)  I70.0       2. SVT (supraventricular tachycardia) (CMS HCC)  I47.10       3. Chronic obstructive pulmonary disease, unspecified COPD type (CMS HCC)  J44.9       4. 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 Need for immunization against influenza  Z23 FLUAD (65+) Flu Vaccine,0.60mL IM (Admin)      6. Essential hypertension  I10 LIPID PANEL     CBC/DIFF     URINALYSIS, MACROSCOPIC AND MICROSCOPIC W/CULTURE REFLEX     BASIC METABOLIC PANEL     HEPATIC FUNCTION PANEL     THYROID  STIMULATING HORMONE (SENSITIVE TSH)     CBC/DIFF     BASIC METABOLIC PANEL     URINALYSIS, MACROSCOPIC AND MICROSCOPIC W/CULTURE REFLEX     LIPID PANEL     HEPATIC FUNCTION PANEL     THYROID  STIMULATING HORMONE (SENSITIVE TSH)     MAGNESIUM       7. Hyperlipidemia, mixed  E78.2       8. GAD (generalized anxiety disorder)  F41.1       9. Right hip pain  M25.551 XR HIP RIGHT W PELVIS 2-3 VIEWS     XR LUMBAR SPINE AP/OBLIQUES/LAT/SPOT      10. Hypomagnesemia  E83.42          Plan:  Orders Placed This Encounter    XR HIP RIGHT W PELVIS 2-3 VIEWS    XR LUMBAR SPINE AP/OBLIQUES/LAT/SPOT    FLUAD (65+) Flu Vaccine,0.41mL IM (Admin)    LIPID PANEL    CBC/DIFF    URINALYSIS, MACROSCOPIC AND MICROSCOPIC W/CULTURE REFLEX    BASIC METABOLIC  PANEL    HEPATIC FUNCTION PANEL    THYROID  STIMULATING HORMONE (SENSITIVE TSH)    CBC/DIFF  BASIC METABOLIC PANEL    URINALYSIS, MACROSCOPIC AND MICROSCOPIC W/CULTURE REFLEX    LIPID PANEL    HEPATIC FUNCTION PANEL    THYROID  STIMULATING HORMONE (SENSITIVE TSH)    MAGNESIUM     CBC WITH DIFF    URINALYSIS, MACROSCOPIC    URINALYSIS, MICROSCOPIC     We are going to have the patient get an x-ray of the right hip and lumbar spine.  She got a flu shot today.  Today I ordered a lipid panel CBC with diff urinalysis magnesium  level basic metabolic panel hepatic function panel thyroid -stimulating hormone.  Ordered a same thing to be done in 3 months.  She will continue her albuterol  inhaler for asthma.  She will also continue her Breo Ellipta  and nebulization when she needs it.  She has Cleocin to take before she has a canal for hypertension and rate control she will continue her metoprolol  and Cardizem and Cozaar  and limit salt to no more than 2 g a day for her mixed hyperlipidemia she will continue her Zocor  and a low-fat low-cholesterol heart healthy diet.  For her hypomagnesemia she will continue her magnesium  oxide and for anxiety she will continue her Zoloft  and BuSpar  she will continue her vitamin-D for vitamin-D deficiency.    Return in about 3 months (around 08/26/2024).    Layman Law, DO     Portions of this note may be dictated using voice recognition software or a dictation service. Variances in spelling and vocabulary are possible and unintentional. Not all errors are caught/corrected. Please notify the dino if any discrepancies are noted or if the meaning of any statement is not clear.        [1]   Allergies  Allergen Reactions    Penicillins Nausea/ Vomiting    Sulfa (Sulfonamides) Nausea/ Vomiting

## 2024-05-26 NOTE — Nursing Note (Signed)
 05/26/24 0935   PHQ 9 (follow up)   Little interest or pleasure in doing things. 0   Feeling down, depressed, or hopeless 0

## 2024-05-26 NOTE — Telephone Encounter (Signed)
 RX approved and encounter closed
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
## 2024-05-28 ENCOUNTER — Encounter (INDEPENDENT_AMBULATORY_CARE_PROVIDER_SITE_OTHER): Payer: Self-pay | Admitting: Family Medicine

## 2024-05-28 LAB — URINE CULTURE,ROUTINE: URINE CULTURE: 5000 — AB

## 2024-06-01 ENCOUNTER — Ambulatory Visit: Attending: Family Medicine

## 2024-06-01 ENCOUNTER — Other Ambulatory Visit (INDEPENDENT_AMBULATORY_CARE_PROVIDER_SITE_OTHER): Payer: Self-pay

## 2024-06-01 ENCOUNTER — Other Ambulatory Visit (INDEPENDENT_AMBULATORY_CARE_PROVIDER_SITE_OTHER): Payer: Self-pay | Admitting: Family Medicine

## 2024-06-01 ENCOUNTER — Other Ambulatory Visit: Payer: Self-pay

## 2024-06-01 DIAGNOSIS — L539 Erythematous condition, unspecified: Secondary | ICD-10-CM | POA: Insufficient documentation

## 2024-06-01 DIAGNOSIS — M25551 Pain in right hip: Secondary | ICD-10-CM

## 2024-06-01 MED ORDER — KETOROLAC 30 MG/ML (1 ML) INJECTION SOLUTION
30.0000 mg | Freq: Once | INTRAMUSCULAR | Status: AC
Start: 2024-06-01 — End: 2024-06-01
  Administered 2024-06-01: 30 mg via INTRAMUSCULAR

## 2024-06-01 MED ORDER — KETOROLAC 30 MG/ML (1 ML) INJECTION SOLUTION
30.0000 mg | Freq: Once | INTRAMUSCULAR | Status: DC
Start: 2024-06-01 — End: 2024-06-01

## 2024-06-02 NOTE — Progress Notes (Deleted)
Toradol given.

## 2024-06-05 ENCOUNTER — Other Ambulatory Visit: Payer: Self-pay

## 2024-06-06 ENCOUNTER — Ambulatory Visit
Admission: RE | Admit: 2024-06-06 | Discharge: 2024-06-06 | Disposition: A | Source: Ambulatory Visit | Attending: Family Medicine

## 2024-06-06 DIAGNOSIS — M4726 Other spondylosis with radiculopathy, lumbar region: Secondary | ICD-10-CM

## 2024-06-06 DIAGNOSIS — M25551 Pain in right hip: Secondary | ICD-10-CM | POA: Insufficient documentation

## 2024-06-08 ENCOUNTER — Other Ambulatory Visit (INDEPENDENT_AMBULATORY_CARE_PROVIDER_SITE_OTHER): Payer: Self-pay | Admitting: Family Medicine

## 2024-06-08 ENCOUNTER — Ambulatory Visit (INDEPENDENT_AMBULATORY_CARE_PROVIDER_SITE_OTHER): Payer: Self-pay

## 2024-06-08 DIAGNOSIS — M545 Low back pain, unspecified: Secondary | ICD-10-CM

## 2024-06-08 DIAGNOSIS — M25551 Pain in right hip: Secondary | ICD-10-CM

## 2024-06-08 MED ORDER — MELOXICAM 7.5 MG TABLET: 7.5000 mg | ORAL_TABLET | Freq: Every day | ORAL | 0 refills | Status: DC

## 2024-07-01 ENCOUNTER — Ambulatory Visit (HOSPITAL_COMMUNITY): Payer: Self-pay

## 2024-07-02 ENCOUNTER — Other Ambulatory Visit (HOSPITAL_COMMUNITY): Payer: Self-pay | Admitting: Family Medicine

## 2024-07-02 DIAGNOSIS — M545 Low back pain, unspecified: Secondary | ICD-10-CM

## 2024-07-02 DIAGNOSIS — M25551 Pain in right hip: Secondary | ICD-10-CM

## 2024-07-03 ENCOUNTER — Ambulatory Visit (HOSPITAL_COMMUNITY): Payer: Self-pay

## 2024-07-20 ENCOUNTER — Emergency Department (HOSPITAL_COMMUNITY)

## 2024-07-20 ENCOUNTER — Encounter (HOSPITAL_COMMUNITY): Payer: Self-pay

## 2024-07-20 ENCOUNTER — Emergency Department
Admission: EM | Admit: 2024-07-20 | Discharge: 2024-07-20 | Disposition: A | Discharge status: Home / Self Care | Attending: NURSE PRACTITIONER | Admitting: NURSE PRACTITIONER

## 2024-07-20 ENCOUNTER — Other Ambulatory Visit: Payer: Self-pay

## 2024-07-20 DIAGNOSIS — G8929 Other chronic pain: Secondary | ICD-10-CM

## 2024-07-20 DIAGNOSIS — Z96642 Presence of left artificial hip joint: Secondary | ICD-10-CM | POA: Insufficient documentation

## 2024-07-20 DIAGNOSIS — Z91148 Patient's other noncompliance with medication regimen for other reason: Secondary | ICD-10-CM | POA: Insufficient documentation

## 2024-07-20 DIAGNOSIS — M25551 Pain in right hip: Secondary | ICD-10-CM | POA: Insufficient documentation

## 2024-07-20 DIAGNOSIS — Z8781 Personal history of (healed) traumatic fracture: Secondary | ICD-10-CM | POA: Insufficient documentation

## 2024-07-20 DIAGNOSIS — M810 Age-related osteoporosis without current pathological fracture: Secondary | ICD-10-CM

## 2024-07-20 DIAGNOSIS — M1611 Unilateral primary osteoarthritis, right hip: Secondary | ICD-10-CM

## 2024-07-20 MED ORDER — SODIUM CHLORIDE 0.9% FLUSH BAG - 250 ML: INTRAVENOUS | Status: DC | PRN

## 2024-07-20 MED ORDER — SODIUM CHLORIDE 0.9 % IV BOLUS: 500.0000 mL | INJECTION | Status: DC

## 2024-07-20 MED ORDER — SODIUM CHLORIDE 0.9 % (FLUSH) INJECTION SYRINGE: 3.0000 mL | INJECTION | Freq: Three times a day (TID) | INTRAMUSCULAR | Status: DC

## 2024-07-20 MED ORDER — ACETAMINOPHEN 325 MG TABLET
ORAL_TABLET | ORAL | Status: AC
  Filled 2024-07-20: qty 2

## 2024-07-20 MED ORDER — DEXTROSE 5% IN WATER (D5W) FLUSH BAG - 250 ML: INTRAVENOUS | Status: DC | PRN

## 2024-07-20 MED ORDER — KETOROLAC 30 MG/ML (1 ML) INJECTION SOLUTION
15.0000 mg | INTRAMUSCULAR | Status: AC
  Administered 2024-07-20: 15 mg via INTRAVENOUS

## 2024-07-20 MED ORDER — ACETAMINOPHEN 325 MG TABLET
650.0000 mg | ORAL_TABLET | ORAL | Status: AC
  Administered 2024-07-20: 650 mg via ORAL

## 2024-07-20 MED ORDER — KETOROLAC 30 MG/ML (1 ML) INJECTION SOLUTION
INTRAMUSCULAR | Status: AC
  Filled 2024-07-20: qty 1

## 2024-07-20 MED ORDER — SODIUM CHLORIDE 0.9 % (FLUSH) INJECTION SYRINGE: 3.0000 mL | INJECTION | INTRAMUSCULAR | Status: DC | PRN

## 2024-07-20 MED ORDER — ACETAMINOPHEN 325 MG TABLET: 650.0000 mg | ORAL_TABLET | ORAL | Status: DC

## 2024-07-20 MED ORDER — ACETAMINOPHEN 1,000 MG/100 ML (10 MG/ML) INTRAVENOUS SOLUTION: 1000.0000 mg | Freq: Four times a day (QID) | INTRAVENOUS | Status: DC | PRN

## 2024-07-20 MED ORDER — KETOROLAC 30 MG/ML (1 ML) INJECTION SOLUTION: 30.0000 mg | INTRAMUSCULAR | Status: DC

## 2024-07-20 MED ORDER — SODIUM CHLORIDE 0.9 % IV BOLUS
1000.0000 mL | INJECTION | Status: DC
  Administered 2024-07-20: 0 mL via INTRAVENOUS
  Administered 2024-07-20: 1000 mL via INTRAVENOUS

## 2024-07-20 NOTE — ED Triage Notes (Addendum)
 Multiple falls the past couple of days, reports left leg pain. Pt non compliant with home medications    PRS: transport, BS 117

## 2024-07-20 NOTE — ED Provider Notes (Signed)
 [facility]  ED Primary Provider Note        Arrival: The patient arrived by Ambulance     Consent for AI Scribe software [employer] discussed/obtained verbally for encounter with patient    History of Present Illness   chief complaint  Veronica Willis is a 83 y.o. female who had concerns including Leg Pain.     History of Present Illness  Veronica Willis is an 83 year old female with a history of hip fracture and osteoporosis who presents with right hip pain.    She has a history of a left hip fracture that required surgical intervention with a plate. 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 Has received injection in the past which does help    Currently, she is experiencing pain in the right hip, which started without any recent fall or injury. The pain is localized to the right hip with no radiation to the leg.    She lives independently in a townhouse and is generally able to care for herself without issues. No recent falls.    She is unsure of her current medication regimen for chronic pain but mentions taking a 'little yellow pill'. Her daughter confirmed that she occasionally receives Toradol  injections for pain management, which have been effective.    Review of Systems     No other overt Review of Systems are noted to be positive except noted in the HPI.    Historical Data   History Reviewed This Encounter:     Past Medical/Surgical/Social History  Past Medical History:   Diagnosis Date    Anxiety     Cardiac arrhythmia     Chronic obstructive airway disease     Hypercholesterolemia     Hypertension     Hypomagnesemia     Intertrochanteric fracture of left femur     Vitamin D deficiency        Past Surgical History:   Procedure Laterality Date    BRONCHOSCOPY      HX CARPAL TUNNEL RELEASE Bilateral     HX CYSTOCELE REPAIR      HX HIP REPLACEMENT Left     HX HIP REPLACEMENT Left        Social History     Socioeconomic History    Marital status: Widowed   Tobacco Use     Smoking status: Never     Passive exposure: Past (husband was a smoker)    Smokeless tobacco: Never   Vaping Use    Vaping status: Never Used   Substance and Sexual Activity    Alcohol use: Yes     Comment: SOCIAL occasional wine    Drug use: Never       Allergies  Allergies   Allergen Reactions    Penicillins Nausea/ Vomiting    Sulfa (Sulfonamides) Nausea/ Vomiting         PHYSICAL EXAM  VITAL SIGNS:  Filed Vitals:    07/20/24 1930 07/20/24 1945 07/20/24 2000 07/20/24 2030   BP: 111/74 101/62 113/63 121/74   Pulse: 89 77 76 73   Resp: (!) 21 18 (!) 25 20   Temp:       SpO2: 95% 95% 93% 96%     Constitutional: Awake. Average body weight. Generally healthy appearing. No distress noted.   Cardiovascular: Regular rate. No murmur or gallop heard. No swelling to extremities  Pulmonary/Chest: Breath sounds clear and equal bilaterally. No chest tenderness. No respiratory distress.  Abdominal: Bowel sound normal. Abdomen soft, no tenderness, rebound or guarding.    Musculoskeletal: No tenderness or deformity. Normal muscle tone and strength.   Skin: warm and dry. No rash, redness, or bruising  Psychiatric: normal mood and affect. Behavior is normal.   Neurological: Alert, oriented. No focal weakness noted. No sensory deficit. No speech disturbances       Diagnostics    Labs  No results found for any visits on 07/20/24.    Radiology  Results for orders placed or performed during the hospital encounter of 07/20/24   XR HIP RIGHT W PELVIS 2-3 VIEWS     Status: None    Narrative    Skyleigh S Ducharme    RADIOLOGIST: Norleen Dempsey Gilles, MD    XR HIP RIGHT W PELVIS 2-3 VIEWS performed on 07/20/2024 7:35 PM    CLINICAL HISTORY: R hip pain; history of osteoarthritis. Denies injury.  right hip pain with no known injury. hx of osteoarthritis.    TECHNIQUE:  3 views of the right hip including AP pelvis.    COMPARISON:  05/18/2024    FINDINGS:   No suspicious bone lesion. The bones are osteopenic. Again noted are bony and disc degenerative  changes involving the lower lumbar spine. The patient has undergone left hip arthroplasty. The visualized prosthetic components are unremarkable. There is limited evaluation of the proximal right femur due to overlying radiodensity. No gross fracture is noted. There is no evidence of AVN.  Normal alignment.  Soft tissues are unremarkable.        Impression    No acute findings or significant degenerative changes.    Limited evaluation due to radio densities overlying the right hip region..               Radiologist location ID: [id number]         Results        Medical Decision Making     Medical Decision Making  An 83 year old female with a history of left hip fracture status post plate placement and osteoporosis presented with acute exacerbation of chronic right hip pain without recent trauma or falls. She reported prior relief with Toradol  injections and denied other symptoms. Examination revealed localized right hip tenderness and mild redness, with no evidence of injury or involvement of the lower extremity. A portion of the history regarding pain management and medication preferences was obtained from the patient's daughter via telephone.    Differential diagnosis includes, but is not limited to:  - Right hip osteoarthritis and osteoporosis exacerbation: Chronic right hip pain is likely due to underlying osteoarthritis and osteoporosis, with recent worsening in the absence of trauma or acute injury.  - Acute hip injury (fracture or dislocation): Acute injury is less likely given the absence of trauma, falls, or significant physical findings aside from mild redness and tenderness.    Right hip pain due to osteoarthritis and osteoporosis  - Order right hip X-ray to assess current status.  - Prescribe oral pain medication for symptom relief.  - Advised follow-up with Dr. Darra    Medical Decision Making  Amount and/or Complexity of Data Reviewed  Radiology: ordered. Decision-making details documented in ED  Course.    Risk  OTC drugs.  Prescription drug management.        ED Course  ED Course as of 07/20/24 2003   Mon Jul 20, 2024   2003 XR HIP RIGHT W PELVIS 2-3 VIEWS  FINDINGS:   No suspicious bone lesion.  The bones are osteopenic. Again noted are bony and disc degenerative changes involving the lower lumbar spine. The patient has undergone left hip arthroplasty. The visualized prosthetic components are unremarkable. There is limited evaluation of the proximal right femur due to overlying radiodensity. No gross fracture is noted. There is no evidence of AVN.  Normal alignment.  Soft tissues are unremarkable.        IMPRESSION:  No acute findings or significant degenerative changes.     Limited evaluation due to radio densities overlying the right hip region..         Medications Ordered/Administered in the ED   ketorolac  (TORADOL ) 30 mg/mL injection (has no administration in time range)   acetaminophen  (TYLENOL ) tablet (has no administration in time range)       Clinical Impression   Chronic hip pain, right (Primary)         Following the history, physical exam, and ED workup, the patient was deemed stable and suitable for discharge. The patient/caregiver was advised to return to the ED for any new or worsening symptoms. Discharge medications, and follow-up instructions were discussed with the patient/caregiver in detail, who verbalizes understanding. The patient/caregiver is in agreement and is comfortable with the plan of care.    Disposition: Discharged         Current Discharge Medication List        CONTINUE these medications - NO CHANGES were made during your visit.        Details   albuterol  sulfate 90 mcg/actuation oral inhaler  Commonly known as: PROVENTIL  or VENTOLIN  or PROAIR    1-2 Puffs, EVERY 4 HOURS PRN  Refills: 0     Breo Ellipta  100-25 mcg/dose Disk with Device  Generic drug: fluticasone  furoate-vilanteroL   1 Inhalation, Daily  Refills: 0     busPIRone  5 mg Tablet  Commonly known as: BUSPAR    5 mg,  Oral, 3 TIMES DAILY  Qty: 270 Tablet  Refills: 1     clindamycin 150 mg Capsule  Commonly known as: CLEOCIN   take FOUR capsules ONE hour BEFORE appointment  Refills: 0     dilTIAZem 60 mg Tablet  Commonly known as: CARDIZEM   60 mg, DAILY PRN  Refills: 0     ergocalciferol (vitamin D2) 1,250 mcg (50,000 unit) Capsule  Commonly known as: DRISDOL   50,000 Units, Oral, EVERY 7 DAYS  Qty: 12 Capsule  Refills: 1     ipratropium-albuteroL  0.5 mg-3 mg(2.5 mg base)/3 mL nebulizer solution  Commonly known as: DUONEB   3 mL, Nebulization, EVERY 4 HOURS PRN  Qty: 360 mL  Refills: 0     losartan  100 mg Tablet  Commonly known as: COZAAR    100 mg, Oral, Daily  Qty: 90 Tablet  Refills: 1     magnesium  oxide 400 mg Tablet   400 mg, 2 TIMES DAILY  Refills: 0     meloxicam  7.5 mg Tablet  Commonly known as: MOBIC    7.5 mg, Oral, Daily  Qty: 90 Tablet  Refills: 0     metoprolol  tartrate 25 mg Tablet  Commonly known as: LOPRESSOR    25 mg, 3 TIMES DAILY  Refills: 0     Restasis 0.05 % Dropperette  Generic drug: cycloSPORINE   1 Drop, EVERY 12 HOURS  Refills: 0     sertraline  100 mg Tablet  Commonly known as: ZOLOFT    100 mg, Oral, Daily  Qty: 90 Tablet  Refills: 1     simvastatin  40 mg  Tablet  Commonly known as: ZOCOR    40 mg, Oral, EVERY EVENING  Qty: 90 Tablet  Refills: 1            Follow up:   Halina, Asano, DO  [address]  [place] [address]  [phone]    Schedule an appointment as soon as possible for a visit           Viktoriya Glaspy K. Darryle Dennie- APRN, FNP-C, [facility]  [facility]  Department of Emergency Medicine

## 2024-07-20 NOTE — ED Nurses Note (Signed)

## 2024-07-20 NOTE — Discharge Instructions (Signed)
Please take Ibuprofen, Naproxen or Tylenol  for pain. These are available over the counter.   You may take Ibuprofen 600 mg every 8 hours with food for pain. Alternatively, you may take Naproxen 250-500 mg every 12 hours with food for pain.  You may also take Tylenol '650mg'$  every 4-6 hours as needed for pain.   Evidence based research has shown taking Ibuprofen or Naproxen & Tylenol together work the same for pain than opioids or narcotic pain medications.

## 2024-07-21 ENCOUNTER — Ambulatory Visit: Attending: Family | Admitting: Family

## 2024-07-21 ENCOUNTER — Telehealth (HOSPITAL_COMMUNITY): Payer: Self-pay | Admitting: Family Medicine

## 2024-07-21 ENCOUNTER — Encounter (INDEPENDENT_AMBULATORY_CARE_PROVIDER_SITE_OTHER): Payer: Self-pay | Admitting: Family

## 2024-07-21 VITALS — BP 128/72 | HR 86 | Temp 98.5°F | Ht 62.0 in | Wt 129.0 lb

## 2024-07-21 DIAGNOSIS — M25551 Pain in right hip: Secondary | ICD-10-CM | POA: Insufficient documentation

## 2024-07-21 DIAGNOSIS — M51369 Other intervertebral disc degeneration, lumbar region without mention of lumbar back pain or lower extremity pain: Secondary | ICD-10-CM | POA: Insufficient documentation

## 2024-07-21 MED ORDER — DEXAMETHASONE SODIUM PHOSPHATE 4 MG/ML INJECTION SOLUTION
4.0000 mg | INTRAMUSCULAR | Status: AC
  Administered 2024-07-21: 4 mg via INTRAMUSCULAR

## 2024-07-21 MED ORDER — KETOROLAC 30 MG/ML (1 ML) INJECTION SOLUTION
15.0000 mg | Freq: Once | INTRAMUSCULAR | Status: AC
  Administered 2024-07-21: 15 mg via INTRAMUSCULAR
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
## 2024-07-21 NOTE — Telephone Encounter (Signed)
 Post Ed Follow-Up    Post ED Follow-Up:   Document completed and/or attempted interactive contact(s) after transition to home after emergency department stay.:   Transition Facility and relevant Date:   Discharge Date: 07/20/24  Discharge from [facility] Emergency Department?: Yes  Discharge Facility: [facility]  Contacted by: Nichole Lulas RN  Contact method: Patient/Caregiver Telephone  Contact completed: 07/21/2024 12:04 PM  MyChart message sent?: No  Was the AVS reviewed with patient?: Yes  Did the patient attempt to reach their PCP prior to going to the ED?: No  How is the patient recovering?: Improving  Medications prescribed: No  Interventions: Provided patient education  Reviewed patient MyChart access and set up with patient. Patient has MyChart set up and understands/utilizes.    Verbal understanding of discharge and follow up instructions,reinforced to reach out to PCP or specialist for any additional discharge follow up questions or concerns.   Provided and reviewed telemedicine options available for future non emergent needs such as urgent care video visits or E-visits.     Discussed and provided the Nurse Navigator 24/7 triage line, [phone],  for any questions or needs if unable to reach PCP or specialty offices.   Chart reviewed and or discussed with patient for any health needs/issues, risk score, readmissions/ED utilization. Referrals to population health?No needs identified.  Patient to contact clinic to schedule follow up. Confirmed clinic(s) contact numbers with patient.     Will be calling PCP to request injection he previously gave her.     Nurse Navigator [employer]

## 2024-07-21 NOTE — Progress Notes (Unsigned)
 FAMILY MEDICINE, MEDICAL OFFICE BUILDING  [address]  [place] [address]  Operated by [facility]        Name: Veronica Willis MRN:  [id number]   Date: 07/21/2024 Age: 83 y.o.          Provider: Alan LOISE Mulch, APRN, CNP    Reason for visit: Hip Pain (Right hip pain. States she went to the ER and doesn't believe they gave her the full dose of medicine that was ordered. )      History of Present Illness:  Veronica Willis is a 83 y.o. female presenting with ongoing R hip pain. Denies new injury.   XR 05/26/24: mild OA/spurring. XR LS advanced degenerative changes. Compression fracture.   MRI LS 06/06/24: advanced degenerative changes with effacement. Order for PT.   ED 07/20/24 XR no acute findings. Apap IV. Tordal IV. She is concerned that they did not give her the full dose of medication.   Sleep disrupted d/t pain.     Historical Data    Past Medical History:  Past Medical History:   Diagnosis Date    Anxiety     Cardiac arrhythmia     Chronic obstructive airway disease     Hypercholesterolemia     Hypertension     Hypomagnesemia     Intertrochanteric fracture of left femur     Vitamin D deficiency          Past Surgical History:  Past Surgical History:   Procedure Laterality Date    BRONCHOSCOPY      HX CARPAL TUNNEL RELEASE Bilateral     HX CYSTOCELE REPAIR      HX HIP REPLACEMENT Left     HX HIP REPLACEMENT Left          Allergies:  Allergies[1]  Medications:  Current Outpatient Medications   Medication Sig    albuterol  sulfate (PROVENTIL  OR VENTOLIN  OR PROAIR ) 90 mcg/actuation Inhalation oral inhaler 1-2 Puffs Every 4 hours as needed    BREO ELLIPTA  100-25 mcg/dose Inhalation Disk with Device Take 1 Inhalation by inhalation Daily    busPIRone  (BUSPAR ) 5 mg Oral Tablet TAKE ONE TABLET BY MOUTH THREE TIMES DAILY    clindamycin (CLEOCIN) 150 mg Oral Capsule take FOUR capsules ONE hour BEFORE appointment    dilTIAZem (CARDIZEM) 60 mg Oral Tablet Take 1 Tablet (60 mg total) by mouth Once per  day as needed    ergocalciferol, vitamin D2, (DRISDOL) 1,250 mcg (50,000 unit) Oral Capsule TAKE ONE CAPSULE BY MOUTH once weekly    ipratropium-albuterol  0.5 mg-3 mg(2.5 mg base)/3 mL Solution for Nebulization Take 3 mL by nebulization Every 4 hours as needed for up to 180 days    losartan  (COZAAR ) 100 mg Oral Tablet TAKE ONE TABLET BY MOUTH ONCE DAILY    magnesium  oxide 400 mg Oral Tablet Take 1 Tablet (400 mg total) by mouth Twice daily    meloxicam  (MOBIC ) 7.5 mg Oral Tablet Take 1 Tablet (7.5 mg total) by mouth Daily for 90 days    metoprolol  tartrate (LOPRESSOR ) 25 mg Oral Tablet Take 1 Tablet (25 mg total) by mouth Three times a day    RESTASIS 0.05 % Ophthalmic Dropperette Instill 1 Drop into both eyes Every 12 hours    sertraline  (ZOLOFT ) 100 mg Oral Tablet TAKE ONE TABLET BY MOUTH ONCE DAILY    simvastatin  (ZOCOR ) 40 mg Oral Tablet TAKE ONE TABLET BY MOUTH IN THE EVENING     Family History:  Family Medical History:       Problem Relation (Age of Onset)    Congestive Heart Failure Mother    Diabetes type II Sister    Prostate Cancer Father    Stroke Sister            Social History:  Social History     Socioeconomic History    Marital status: Widowed   Tobacco Use    Smoking status: Never     Passive exposure: Past (husband was a smoker)    Smokeless tobacco: Never   Vaping Use    Vaping status: Never Used   Substance and Sexual Activity    Alcohol use: Yes     Comment: SOCIAL occasional wine    Drug use: Never           Physical Exam:  Vital Signs:  Vitals:    07/21/24 1605   BP: 128/72   Pulse: 86   Temp: 36.9 C (98.5 F)   TempSrc: Temporal   SpO2: 92%   Weight: 58.5 kg (129 lb)   Height: 1.575 m (5' 2)   BMI: 23.59     Physical Exam  Vitals and nursing note reviewed.   Constitutional:       General: She is not in acute distress.  Pulmonary:      Effort: Pulmonary effort is normal.   Neurological:      General: No focal deficit present.      Mental Status: She is alert.   Psychiatric:         Attention  and Perception: Attention normal.         Mood and Affect: Mood normal.         Speech: Speech normal.         Behavior: Behavior normal. Behavior is cooperative.         Thought Content: Thought content normal.          Assessment:    ICD-10-CM    1. Right hip pain  M25.551       2. DDD (degenerative disc disease), lumbar  M51.369            Plan:  Orders Placed This Encounter    ketorolac  (TORADOL ) 30 mg/mL injection    dexAMETHasone  4 mg/mL injection     Acute exacerbation of chronic pain: acute treatment as directed. She is not diabetic. She is scheduled to start PT on Friday. Consider interventional pain management consult. She is taking meloxicam . eGFR 05/26/24 83. Advised stretching exercises. Reviewed fall risk precautions.          Return if symptoms worsen or fail to improve.    Alan LOISE Mulch, APRN, CNP     Portions of this note may be dictated using voice recognition software or a dictation service. Variances in spelling and vocabulary are possible and unintentional. Not all errors are caught/corrected. Please notify the dino if any discrepancies are noted or if the meaning of any statement is not clear.          [1]   Allergies  Allergen Reactions    Penicillins Nausea/ Vomiting    Sulfa (Sulfonamides) Nausea/ Vomiting

## 2024-07-24 ENCOUNTER — Ambulatory Visit (HOSPITAL_COMMUNITY): Payer: Self-pay

## 2024-07-31 ENCOUNTER — Telehealth (HOSPITAL_BASED_OUTPATIENT_CLINIC_OR_DEPARTMENT_OTHER): Payer: Self-pay | Admitting: Family Medicine

## 2024-07-31 DIAGNOSIS — Z538 Procedure and treatment not carried out for other reasons: Secondary | ICD-10-CM

## 2024-07-31 NOTE — Nursing Note (Signed)
 Received call from patient stating that she has been sick for 2 weeks. States she has had diarrhea and vomiting and had to go to the ER and get fluid. Patient states it helped some but she still has some symptoms and asks is there anything that can be called in for this because she does not feel like going back to the ER and laying there for hours.

## 2024-08-03 ENCOUNTER — Ambulatory Visit (INDEPENDENT_AMBULATORY_CARE_PROVIDER_SITE_OTHER): Payer: Self-pay | Admitting: PHYSICIAN ASSISTANT

## 2024-08-03 MED ORDER — LOPERAMIDE 2 MG CAPSULE: ORAL_CAPSULE | ORAL | 0 refills | Status: AC

## 2024-08-03 MED ORDER — ONDANSETRON 4 MG DISINTEGRATING TABLET: 4.0000 mg | ORAL_TABLET | Freq: Three times a day (TID) | ORAL | 0 refills | Status: AC | PRN

## 2024-08-04 ENCOUNTER — Ambulatory Visit: Attending: PHYSICIAN ASSISTANT | Admitting: Family Medicine

## 2024-08-04 ENCOUNTER — Encounter (INDEPENDENT_AMBULATORY_CARE_PROVIDER_SITE_OTHER): Payer: Self-pay | Admitting: Family Medicine

## 2024-08-04 ENCOUNTER — Other Ambulatory Visit: Payer: Self-pay

## 2024-08-04 VITALS — BP 109/86 | HR 83 | Temp 97.9°F | Resp 16 | Ht 62.0 in | Wt 118.0 lb

## 2024-08-04 DIAGNOSIS — J189 Pneumonia, unspecified organism: Secondary | ICD-10-CM | POA: Insufficient documentation

## 2024-08-04 MED ORDER — METHYLPREDNISOLONE ACETATE 40 MG/ML SUSPENSION FOR INJECTION
40.0000 mg | INTRAMUSCULAR | Status: AC
  Administered 2024-08-04: 40 mg via INTRAMUSCULAR

## 2024-08-04 MED ORDER — METHYLPREDNISOLONE 4 MG TABLETS IN A DOSE PACK: ORAL_TABLET | ORAL | 1 refills | Status: DC

## 2024-08-04 MED ORDER — AZITHROMYCIN 250 MG TABLET: ORAL_TABLET | ORAL | 0 refills | Status: DC

## 2024-08-04 MED ORDER — DEXAMETHASONE SODIUM PHOSPHATE 4 MG/ML INJECTION SOLUTION
4.0000 mg | INTRAMUSCULAR | Status: AC
  Administered 2024-08-04: 4 mg via INTRAMUSCULAR

## 2024-08-04 NOTE — Progress Notes (Signed)
 FAMILY MEDICINE, MEDICAL OFFICE BUILDING  [address]  [place] [address]  Operated by [facility]     Name: Veronica Willis MRN:  [id number]   Date: 08/04/2024 Age: 84 y.o.          Provider: Layman Law, DO    Reason for visit: Nausea, Vomiting, and Diarrhea      History of Present Illness:  08/04/2024:  This 84 year old female complains of nausea vomiting diarrhea for 2 weeks better but it started again with diarrhea 3 or 4 days after Christmas she also complains of dysphagia her chest is congested for 2 weeks she has a productive cough of green mucus almost clear now in his sore throat she has postnasal drip and nasal congestion.  She has a history of Chronic Obstructive Pulmonary Disease in his requesting antibiotic and steroids which helped her in the past  Historical Data    Past Medical History:  Past Medical History:   Diagnosis Date    Anxiety     Cardiac arrhythmia     Chronic obstructive airway disease     Hypercholesterolemia     Hypertension     Hypomagnesemia     Intertrochanteric fracture of left femur     Vitamin D deficiency          Past Surgical History:  Past Surgical History:   Procedure Laterality Date    BRONCHOSCOPY      HX CARPAL TUNNEL RELEASE Bilateral     HX CYSTOCELE REPAIR      HX HIP REPLACEMENT Left     HX HIP REPLACEMENT Left          Allergies:  Allergies[1]  Medications:  Current Outpatient Medications   Medication Sig    albuterol  sulfate (PROVENTIL  OR VENTOLIN  OR PROAIR ) 90 mcg/actuation Inhalation oral inhaler 1-2 Puffs Every 4 hours as needed    azithromycin  (ZITHROMAX ) 250 mg Oral Tablet Take 500 mg (2 tab) on day 1; take 250 mg (1 tab) on days 2-5.    BREO ELLIPTA  100-25 mcg/dose Inhalation Disk with Device Take 1 Inhalation by inhalation Daily    busPIRone  (BUSPAR ) 5 mg Oral Tablet TAKE ONE TABLET BY MOUTH THREE TIMES DAILY    clindamycin (CLEOCIN) 150 mg Oral Capsule take FOUR capsules ONE hour BEFORE appointment (Patient not taking: Reported on  08/04/2024)    dilTIAZem (CARDIZEM) 60 mg Oral Tablet Take 1 Tablet (60 mg total) by mouth Once per day as needed    ergocalciferol, vitamin D2, (DRISDOL) 1,250 mcg (50,000 unit) Oral Capsule TAKE ONE CAPSULE BY MOUTH once weekly    ipratropium-albuterol  0.5 mg-3 mg(2.5 mg base)/3 mL Solution for Nebulization Take 3 mL by nebulization Every 4 hours as needed for up to 180 days    loperamide  (IMODIUM ) 2 mg Oral Capsule Initial: 4 mg, followed by 2 mg after each loose stool; maximum: 8 mg/day. Limit use to < 48 hours. Indications: diarrhea    losartan  (COZAAR ) 100 mg Oral Tablet TAKE ONE TABLET BY MOUTH ONCE DAILY    magnesium  oxide 400 mg Oral Tablet Take 1 Tablet (400 mg total) by mouth Twice daily    meloxicam  (MOBIC ) 7.5 mg Oral Tablet Take 1 Tablet (7.5 mg total) by mouth Daily for 90 days    Methylprednisolone  (MEDROL , PAK,) 4 mg Oral Tablets, Dose Pack Take as instructed.    metoprolol  tartrate (LOPRESSOR ) 25 mg Oral Tablet Take 1 Tablet (25 mg total) by mouth Three times a day  ondansetron  (ZOFRAN  ODT) 4 mg Oral Tablet, Rapid Dissolve Take 1 Tablet (4 mg total) by mouth Every 8 hours as needed for Nausea/Vomiting    RESTASIS 0.05 % Ophthalmic Dropperette Instill 1 Drop into both eyes Every 12 hours    sertraline  (ZOLOFT ) 100 mg Oral Tablet TAKE ONE TABLET BY MOUTH ONCE DAILY    simvastatin  (ZOCOR ) 40 mg Oral Tablet TAKE ONE TABLET BY MOUTH IN THE EVENING     Family History:  Family Medical History:       Problem Relation (Age of Onset)    Congestive Heart Failure Mother    Diabetes type II Sister    Prostate Cancer Father    Stroke Sister            Social History:  Social History     Socioeconomic History    Marital status: Widowed   Tobacco Use    Smoking status: Never     Passive exposure: Past (husband was a smoker)    Smokeless tobacco: Never   Vaping Use    Vaping status: Never Used   Substance and Sexual Activity    Alcohol use: Yes     Comment: SOCIAL occasional wine    Drug use: Never           Review  of Systems:  Any pertinent Review of Systems as addressed in the HPI above.    Physical Exam:  Vital Signs:  Vitals:    08/04/24 1639   BP: 109/86   Pulse: 83   Resp: 16   Temp: 36.6 C (97.9 F)   TempSrc: Temporal   SpO2: 100%   Weight: 53.5 kg (118 lb)   Height: 1.575 m (5' 2)   BMI: 21.58     Physical Exam  Vitals and nursing note reviewed.   Constitutional:       General: She is awake.      Appearance: Normal appearance. She is well-developed, well-groomed and normal weight.   HENT:      Head: Normocephalic and atraumatic.      Right Ear: Hearing, tympanic membrane, ear canal and external ear normal. Tympanic membrane is not injected.      Left Ear: Hearing, tympanic membrane, ear canal and external ear normal. Tympanic membrane is not injected.      Nose: Congestion and rhinorrhea present.      Right Turbinates: Enlarged and swollen.      Left Turbinates: Enlarged and swollen.      Mouth/Throat:      Mouth: Mucous membranes are moist.      Pharynx: Posterior oropharyngeal erythema and postnasal drip present.   Eyes:      Extraocular Movements: Extraocular movements intact.      Conjunctiva/sclera: Conjunctivae normal.      Pupils: Pupils are equal, round, and reactive to light.   Cardiovascular:      Rate and Rhythm: Normal rate and regular rhythm.      Pulses:           Carotid pulses are 2+ on the right side and 2+ on the left side.       Radial pulses are 2+ on the right side and 2+ on the left side.        Dorsalis pedis pulses are 2+ on the right side and 2+ on the left side.        Posterior tibial pulses are 2+ on the right side and 2+ on the left side.  Heart sounds: Normal heart sounds, S1 normal and S2 normal.   Pulmonary:      Effort: Pulmonary effort is normal.      Breath sounds: Examination of the left-lower field reveals rhonchi. Rhonchi present. No decreased breath sounds, wheezing or rales.   Abdominal:      General: Abdomen is flat. Bowel sounds are normal.      Palpations: Abdomen is  soft.      Tenderness: There is no abdominal tenderness. There is no right CVA tenderness or left CVA tenderness.   Musculoskeletal:         General: Normal range of motion.      Cervical back: Normal range of motion and neck supple.      Right lower leg: No edema.      Left lower leg: No edema.   Skin:     General: Skin is warm and dry.      Capillary Refill: Capillary refill takes less than 2 seconds.   Neurological:      General: No focal deficit present.      Mental Status: She is alert and oriented to person, place, and time. Mental status is at baseline.      Cranial Nerves: Cranial nerves 2-12 are intact.      Sensory: Sensation is intact.      Motor: Motor function is intact.      Coordination: Coordination is intact.      Gait: Gait abnormal.   Psychiatric:         Mood and Affect: Mood normal.         Behavior: Behavior normal. Behavior is cooperative.         Thought Content: Thought content normal.         Judgment: Judgment normal.       Assessment:    ICD-10-CM    1. Pneumonia of left lower lobe due to infectious organism  J18.9 XR CHEST PA AND LATERAL         Plan:  Orders Placed This Encounter    XR CHEST PA AND LATERAL    azithromycin  (ZITHROMAX ) 250 mg Oral Tablet    Methylprednisolone  (MEDROL , PAK,) 4 mg Oral Tablets, Dose Pack    methylPREDNISolone  acetate (DEPO-medrol ) 40 mg/mL injection    dexAMETHasone  4 mg/mL injection     Today I ordered a x-ray of the chest.  Today I ordered Zithromax  Z-Pak and a Medrol  Dosepak and I we are going to give her 40 mg of Depo-Medrol  and 4 mg of Decadron  per the request of the patient.  If she is not any better in his couple days she should come back.  Otherwise I will see her in 10 days to go over her labs.    Return in about 10 days (around 08/14/2024).    Layman Law, DO     Portions of this note may be dictated using voice recognition software or a dictation service. Variances in spelling and vocabulary are possible and unintentional. Not all errors are  caught/corrected. Please notify the dino if any discrepancies are noted or if the meaning of any statement is not clear.        [1]   Allergies  Allergen Reactions    Penicillins Nausea/ Vomiting    Sulfa (Sulfonamides) Nausea/ Vomiting

## 2024-08-06 ENCOUNTER — Other Ambulatory Visit (INDEPENDENT_AMBULATORY_CARE_PROVIDER_SITE_OTHER): Payer: Self-pay | Admitting: Family Medicine

## 2024-08-07 ENCOUNTER — Ambulatory Visit
Admission: RE | Admit: 2024-08-07 | Discharge: 2024-08-07 | Disposition: A | Source: Ambulatory Visit | Attending: Family Medicine

## 2024-08-07 ENCOUNTER — Ambulatory Visit (INDEPENDENT_AMBULATORY_CARE_PROVIDER_SITE_OTHER): Payer: Self-pay

## 2024-08-07 ENCOUNTER — Other Ambulatory Visit (INDEPENDENT_AMBULATORY_CARE_PROVIDER_SITE_OTHER): Payer: Self-pay | Admitting: Family Medicine

## 2024-08-07 ENCOUNTER — Other Ambulatory Visit: Payer: Self-pay

## 2024-08-07 DIAGNOSIS — J189 Pneumonia, unspecified organism: Secondary | ICD-10-CM | POA: Insufficient documentation

## 2024-08-07 DIAGNOSIS — R918 Other nonspecific abnormal finding of lung field: Secondary | ICD-10-CM

## 2024-08-07 DIAGNOSIS — M47814 Spondylosis without myelopathy or radiculopathy, thoracic region: Secondary | ICD-10-CM

## 2024-08-07 MED ORDER — LEVOFLOXACIN 750 MG TABLET: 750.0000 mg | ORAL_TABLET | Freq: Every day | ORAL | 0 refills | Status: DC

## 2024-08-14 ENCOUNTER — Other Ambulatory Visit: Payer: Self-pay

## 2024-08-14 ENCOUNTER — Encounter (INDEPENDENT_AMBULATORY_CARE_PROVIDER_SITE_OTHER): Payer: Self-pay | Admitting: Family Medicine

## 2024-08-14 ENCOUNTER — Ambulatory Visit (HOSPITAL_COMMUNITY)

## 2024-08-14 ENCOUNTER — Ambulatory Visit: Attending: Family Medicine | Admitting: Family Medicine

## 2024-08-14 VITALS — BP 101/53 | HR 70 | Temp 98.5°F | Resp 17 | Ht 62.0 in | Wt 114.0 lb

## 2024-08-14 DIAGNOSIS — E782 Mixed hyperlipidemia: Secondary | ICD-10-CM | POA: Insufficient documentation

## 2024-08-14 DIAGNOSIS — J449 Chronic obstructive pulmonary disease, unspecified: Secondary | ICD-10-CM | POA: Insufficient documentation

## 2024-08-14 DIAGNOSIS — I10 Essential (primary) hypertension: Secondary | ICD-10-CM

## 2024-08-14 DIAGNOSIS — I700 Atherosclerosis of aorta: Secondary | ICD-10-CM | POA: Insufficient documentation

## 2024-08-14 DIAGNOSIS — F320 Major depressive disorder, single episode, mild: Secondary | ICD-10-CM | POA: Insufficient documentation

## 2024-08-14 DIAGNOSIS — J189 Pneumonia, unspecified organism: Secondary | ICD-10-CM | POA: Insufficient documentation

## 2024-08-14 LAB — URINALYSIS, MACROSCOPIC
BILIRUBIN: NEGATIVE mg/dL
BLOOD: NEGATIVE mg/dL
GLUCOSE: NEGATIVE mg/dL
KETONES: NEGATIVE mg/dL
NITRITE: NEGATIVE
PH: 6.5 (ref 5.0–8.0)
PROTEIN: NEGATIVE mg/dL
SPECIFIC GRAVITY: 1.023 (ref 1.005–1.030)
UROBILINOGEN: 2 mg/dL

## 2024-08-14 LAB — CBC WITH DIFF
BASOPHIL #: 0.1 x10ˆ3/uL (ref 0.00–0.10)
BASOPHIL %: 1 % (ref 0–1)
EOSINOPHIL #: 0.1 x10ˆ3/uL (ref 0.00–0.50)
EOSINOPHIL %: 1 % (ref 1–7)
HCT: 39.1 % (ref 31.2–41.9)
HGB: 13.3 g/dL (ref 10.9–14.3)
LYMPHOCYTE #: 0.8 x10ˆ3/uL — ABNORMAL LOW (ref 1.10–3.10)
LYMPHOCYTE %: 7 % — ABNORMAL LOW (ref 16–46)
MCH: 31.8 pg (ref 24.7–32.8)
MCHC: 34 g/dL (ref 32.3–35.6)
MCV: 93.6 fL (ref 75.5–95.3)
MONOCYTE #: 0.5 x10ˆ3/uL (ref 0.20–0.90)
MONOCYTE %: 5 % (ref 4–11)
MPV: 6.9 fL — ABNORMAL LOW (ref 7.9–10.8)
NEUTROPHIL #: 9.1 x10ˆ3/uL — ABNORMAL HIGH (ref 1.90–8.20)
NEUTROPHIL %: 86 % — ABNORMAL HIGH (ref 43–77)
PLATELETS: 371 x10ˆ3/uL (ref 140–440)
RBC: 4.18 x10ˆ6/uL (ref 3.63–4.92)
RDW: 12.4 % (ref 12.3–17.7)
WBC: 10.6 x10ˆ3/uL (ref 3.8–11.8)

## 2024-08-14 LAB — LIPID PANEL
CHOL/HDL RATIO: 3.2
CHOLESTEROL: 96 mg/dL (ref <200)
HDL CHOL: 30 mg/dL — ABNORMAL LOW (ref >=40)
LDL CALC: 49 mg/dL (ref 0–100)
TRIGLYCERIDES: 86 mg/dL (ref <=150)
VLDL CALC: 17 mg/dL (ref 0–50)

## 2024-08-14 LAB — BASIC METABOLIC PANEL
ANION GAP: 8 mmol/L (ref 4–13)
BUN/CREA RATIO: 24 — ABNORMAL HIGH (ref 6–22)
BUN: 20 mg/dL (ref 7–25)
CALCIUM: 8.9 mg/dL (ref 8.6–10.3)
CHLORIDE: 104 mmol/L (ref 98–107)
CO2 TOTAL: 25 mmol/L (ref 21–31)
CREATININE: 0.83 mg/dL (ref 0.60–1.30)
ESTIMATED GFR: 70 mL/min/1.73mˆ2 (ref >59)
GLUCOSE: 117 mg/dL — ABNORMAL HIGH (ref 74–109)
OSMOLALITY, CALCULATED: 278 mosm/kg (ref 270–290)
POTASSIUM: 3.8 mmol/L (ref 3.5–5.1)
SODIUM: 137 mmol/L (ref 136–145)

## 2024-08-14 LAB — HEPATIC FUNCTION PANEL
ALBUMIN/GLOBULIN RATIO: 0.8 (ref 0.8–1.4)
ALBUMIN: 3.1 g/dL — ABNORMAL LOW (ref 3.5–5.7)
ALKALINE PHOSPHATASE: 59 U/L (ref 34–104)
ALT (SGPT): 54 U/L — ABNORMAL HIGH (ref 7–52)
AST (SGOT): 37 U/L (ref 13–39)
BILIRUBIN DIRECT: 0.17 md/dL (ref 0.03–0.18)
BILIRUBIN TOTAL: 0.5 mg/dL (ref 0.3–1.0)
BILIRUBIN, INDIRECT: 0.33 mg/dL (ref <=1)
GLOBULIN: 4 — ABNORMAL HIGH (ref 2.0–3.5)
PROTEIN TOTAL: 7.1 g/dL (ref 6.4–8.9)

## 2024-08-14 LAB — URINALYSIS, MICROSCOPIC

## 2024-08-14 LAB — THYROID STIMULATING HORMONE (SENSITIVE TSH): TSH: 1.05 u[IU]/mL (ref 0.450–5.330)

## 2024-08-14 MED ORDER — PROMETHAZINE-DM 6.25 MG-15 MG/5 ML ORAL SYRUP: 5.0000 mL | ORAL_SOLUTION | Freq: Four times a day (QID) | ORAL | 0 refills | Status: AC | PRN

## 2024-08-14 MED ORDER — LEVOFLOXACIN 500 MG TABLET: 500.0000 mg | ORAL_TABLET | Freq: Every day | ORAL | 0 refills | Status: AC

## 2024-08-14 MED ORDER — METHYLPREDNISOLONE ACETATE 40 MG/ML SUSPENSION FOR INJECTION
40.0000 mg | INTRAMUSCULAR | Status: AC
  Administered 2024-08-14: 40 mg via INTRAMUSCULAR

## 2024-08-14 MED ORDER — DEXAMETHASONE SODIUM PHOSPHATE 4 MG/ML INJECTION SOLUTION
4.0000 mg | INTRAMUSCULAR | Status: AC
  Administered 2024-08-14: 4 mg via INTRAMUSCULAR

## 2024-08-14 MED ORDER — PREDNISONE 20 MG TABLET: 50.0000 mg | ORAL_TABLET | Freq: Every day | ORAL | 0 refills | Status: AC

## 2024-08-14 NOTE — Progress Notes (Signed)
 FAMILY MEDICINE, MEDICAL OFFICE BUILDING  [address]  [place] [address]  Operated by [facility]     Name: Veronica Willis MRN:  [id number]   Date: 08/14/2024 Age: 84 y.o.          Provider: Layman Law, DO    Reason for visit: Shortness of Breath      History of Present Illness:  08/14/2024:  This 84 year old female returns because of shortness for breath got worse today she has recently stopped taking the Levaquin  has not ran out she has pain in burning on urination the diarrhea cleaned up 3 days ago.  She feels hot today her hemoglobin was 13.3 hematocrit 39.1 electrolytes were normal with the except for glucose of 117 globulin was elevated at 4 total cholesterol 96 albumin was low at 3.1 she has not got much of an appetite urine sho chest x-ray showed persistent left lower lobe pneumonia.  wed large amount of leukocyte esterase no nitrates 6-8 white blood cells.  Historical Data    Past Medical History:  Past Medical History:   Diagnosis Date    Anxiety     Cardiac arrhythmia     Chronic obstructive airway disease     Hypercholesterolemia     Hypertension     Hypomagnesemia     Intertrochanteric fracture of left femur     Vitamin D deficiency          Past Surgical History:  Past Surgical History:   Procedure Laterality Date    BRONCHOSCOPY      HX CARPAL TUNNEL RELEASE Bilateral     HX CYSTOCELE REPAIR      HX HIP REPLACEMENT Left     HX HIP REPLACEMENT Left          Allergies:  Allergies[1]  Medications:  Current Outpatient Medications   Medication Sig    albuterol  sulfate (PROVENTIL  OR VENTOLIN  OR PROAIR ) 90 mcg/actuation Inhalation oral inhaler 1-2 Puffs Every 4 hours as needed    albuterol  sulfate (PROVENTIL ) 2.5 mg /3 mL (0.083 %) Inhalation nebulizer solution Take 3 mL (2.5 mg total) by nebulization Three times a day as needed for Wheezing    BREO ELLIPTA  100-25 mcg/dose Inhalation Disk with Device Take 1 Inhalation by inhalation Daily    busPIRone  (BUSPAR ) 5 mg Oral Tablet  TAKE ONE TABLET BY MOUTH THREE TIMES DAILY    clindamycin (CLEOCIN) 150 mg Oral Capsule take FOUR capsules ONE hour BEFORE appointment    dilTIAZem (CARDIZEM) 60 mg Oral Tablet Take 1 Tablet (60 mg total) by mouth Once per day as needed    ergocalciferol, vitamin D2, (DRISDOL) 1,250 mcg (50,000 unit) Oral Capsule TAKE ONE CAPSULE BY MOUTH once weekly    ipratropium-albuterol  0.5 mg-3 mg(2.5 mg base)/3 mL Solution for Nebulization Take 3 mL by nebulization Every 4 hours as needed for up to 180 days    levoFLOXacin  (LEVAQUIN ) 500 mg Oral Tablet Take 1 Tablet (500 mg total) by mouth Daily Indications: bacterial pneumonia caused by Streptococcus pneumoniae    loperamide  (IMODIUM ) 2 mg Oral Capsule Initial: 4 mg, followed by 2 mg after each loose stool; maximum: 8 mg/day. Limit use to < 48 hours. Indications: diarrhea    losartan  (COZAAR ) 100 mg Oral Tablet TAKE ONE TABLET BY MOUTH ONCE DAILY    magnesium  oxide 400 mg Oral Tablet Take 1 Tablet (400 mg total) by mouth Twice daily    meloxicam  (MOBIC ) 7.5 mg Oral Tablet Take 1 Tablet (7.5 mg total)  by mouth Daily for 90 days    metoprolol  tartrate (LOPRESSOR ) 25 mg Oral Tablet Take 1 Tablet (25 mg total) by mouth Three times a day    ondansetron  (ZOFRAN  ODT) 4 mg Oral Tablet, Rapid Dissolve Take 1 Tablet (4 mg total) by mouth Every 8 hours as needed for Nausea/Vomiting    predniSONE  (DELTASONE ) 20 mg Oral Tablet Take 2.5 Tablets (50 mg total) by mouth Daily for 5 days    RESTASIS 0.05 % Ophthalmic Dropperette Instill 1 Drop into both eyes Every 12 hours    sertraline  (ZOLOFT ) 100 mg Oral Tablet TAKE ONE TABLET BY MOUTH ONCE DAILY    simvastatin  (ZOCOR ) 40 mg Oral Tablet TAKE ONE TABLET BY MOUTH IN THE EVENING     Family History:  Family Medical History:       Problem Relation (Age of Onset)    Congestive Heart Failure Mother    Diabetes type II Sister    Prostate Cancer Father    Stroke Sister            Social History:  Social History     Socioeconomic History    Marital  status: Widowed   Tobacco Use    Smoking status: Never     Passive exposure: Past (husband was a smoker)    Smokeless tobacco: Never   Vaping Use    Vaping status: Never Used   Substance and Sexual Activity    Alcohol use: Yes     Comment: SOCIAL occasional wine    Drug use: Never           Review of Systems:  Any pertinent Review of Systems as addressed in the HPI above.    Physical Exam:  Vital Signs:  Vitals:    08/14/24 1630   BP: (!) 101/53   Pulse: 70   Resp: 17   Temp: 36.9 C (98.5 F)   TempSrc: Temporal   SpO2: 97%   Weight: 51.7 kg (114 lb)   Height: 1.575 m (5' 2)   BMI: 20.85     Physical Exam  Vitals and nursing note reviewed.   Constitutional:       General: She is awake.      Appearance: Normal appearance. She is well-developed, well-groomed and normal weight.   HENT:      Head: Normocephalic and atraumatic.      Right Ear: Tympanic membrane, ear canal and external ear normal.      Left Ear: Tympanic membrane, ear canal and external ear normal.      Nose: Nose normal.      Mouth/Throat:      Mouth: Mucous membranes are moist.      Pharynx: Oropharynx is clear.   Eyes:      Extraocular Movements: Extraocular movements intact.      Conjunctiva/sclera: Conjunctivae normal.      Pupils: Pupils are equal, round, and reactive to light.   Cardiovascular:      Rate and Rhythm: Normal rate and regular rhythm.      Pulses:           Carotid pulses are 2+ on the right side and 2+ on the left side.       Radial pulses are 2+ on the right side and 2+ on the left side.        Dorsalis pedis pulses are 2+ on the right side and 2+ on the left side.        Posterior tibial pulses are  2+ on the right side and 2+ on the left side.      Heart sounds: Normal heart sounds, S1 normal and S2 normal.   Pulmonary:      Effort: Pulmonary effort is normal.      Breath sounds: Examination of the left-lower field reveals wheezing and rhonchi. Decreased breath sounds, wheezing and rhonchi present. No rales.   Abdominal:       General: Abdomen is flat. Bowel sounds are normal.      Palpations: Abdomen is soft.      Tenderness: There is no abdominal tenderness. There is no right CVA tenderness or left CVA tenderness.   Musculoskeletal:         General: Normal range of motion.      Cervical back: Normal range of motion and neck supple.      Right lower leg: No edema.      Left lower leg: No edema.   Skin:     General: Skin is warm and dry.      Capillary Refill: Capillary refill takes less than 2 seconds.   Neurological:      General: No focal deficit present.      Mental Status: She is alert and oriented to person, place, and time. Mental status is at baseline.      Cranial Nerves: Cranial nerves 2-12 are intact.      Sensory: Sensation is intact.      Motor: Motor function is intact.      Coordination: Coordination is intact.      Gait: Gait is intact.   Psychiatric:         Mood and Affect: Mood normal.         Behavior: Behavior normal. Behavior is cooperative.         Thought Content: Thought content normal.         Judgment: Judgment normal.       Assessment:    ICD-10-CM    1. Atherosclerosis of aorta (CMS HCC)  I70.0       2. 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 Chronic obstructive pulmonary disease, unspecified COPD type (CMS HCC)  J44.9       4. Essential hypertension  I10       5. Hyperlipidemia, mixed  E78.2       6. Pneumonia of left lower lobe due to infectious organism  J18.9 XR CHEST PA AND LATERAL         Plan:  Orders Placed This Encounter    XR CHEST PA AND LATERAL    levoFLOXacin  (LEVAQUIN ) 500 mg Oral Tablet    predniSONE  (DELTASONE ) 20 mg Oral Tablet    methylPREDNISolone  acetate (DEPO-medrol ) 40 mg/mL injection    dexAMETHasone  4 mg/mL injection     I will see the patient back on Tuesday she will get a chest x-ray before she comes back in I am going to start him back on Levaquin  and 50 mg of prednisone  5 days in a row we are going to give her 4 mg of Decadron  and 40 mg of Depo-Medrol  today.  I will see her  back on Tuesday.    Return in about 4 days (around 08/18/2024).    Layman Law, DO     Portions of this note may be dictated using voice recognition software or a dictation service. Variances in spelling and vocabulary are possible and unintentional. Not all errors are caught/corrected. Please notify the  author if any discrepancies are noted or if the meaning of any statement is not clear.          [1]   Allergies  Allergen Reactions    Penicillins Nausea/ Vomiting    Sulfa (Sulfonamides) Nausea/ Vomiting

## 2024-08-17 ENCOUNTER — Other Ambulatory Visit: Payer: Self-pay

## 2024-08-17 ENCOUNTER — Ambulatory Visit
Admission: RE | Admit: 2024-08-17 | Discharge: 2024-08-17 | Disposition: A | Source: Ambulatory Visit | Attending: Family Medicine

## 2024-08-17 DIAGNOSIS — J189 Pneumonia, unspecified organism: Secondary | ICD-10-CM | POA: Insufficient documentation

## 2024-08-18 ENCOUNTER — Ambulatory Visit: Payer: Self-pay | Attending: Family Medicine | Admitting: Family Medicine

## 2024-08-18 ENCOUNTER — Encounter (INDEPENDENT_AMBULATORY_CARE_PROVIDER_SITE_OTHER): Payer: Self-pay | Admitting: Family Medicine

## 2024-08-18 ENCOUNTER — Ambulatory Visit (INDEPENDENT_AMBULATORY_CARE_PROVIDER_SITE_OTHER): Payer: Self-pay

## 2024-08-18 VITALS — BP 129/68 | HR 78 | Temp 98.8°F | Resp 17 | Ht 62.0 in | Wt 116.0 lb

## 2024-08-18 DIAGNOSIS — F411 Generalized anxiety disorder: Secondary | ICD-10-CM | POA: Insufficient documentation

## 2024-08-18 DIAGNOSIS — I10 Essential (primary) hypertension: Secondary | ICD-10-CM | POA: Insufficient documentation

## 2024-08-18 DIAGNOSIS — E782 Mixed hyperlipidemia: Secondary | ICD-10-CM | POA: Insufficient documentation

## 2024-08-18 DIAGNOSIS — J449 Chronic obstructive pulmonary disease, unspecified: Secondary | ICD-10-CM | POA: Insufficient documentation

## 2024-08-18 DIAGNOSIS — J189 Pneumonia, unspecified organism: Secondary | ICD-10-CM | POA: Insufficient documentation

## 2024-08-18 NOTE — Nursing Note (Signed)
 08/18/24 1657   PHQ 9 (follow up)   Little interest or pleasure in doing things. 0   Feeling down, depressed, or hopeless 0

## 2024-08-18 NOTE — Progress Notes (Signed)
 FAMILY MEDICINE, MEDICAL OFFICE BUILDING  [address]  [place] [address]  Operated by [facility]     Name: Veronica Willis MRN:  [id number]   Date: 08/18/2024 Age: 84 y.o.          Provider: Layman Law, DO    Reason for visit: Follow-up After Testing      History of Present Illness:  08/18/2024:  This 84 year old female returns for 10 day follow-up to review her chest x-ray stating in his she feels less short of breath still on the prednisone  and Levaquin  having gained 2 lb since her last office visit.  Her blood pressure has been ranging down so he has been holding the losartan  for the last 4 days and I am going to have him continue to hold it.  Her chest x-ray shows decreased in size of the left perihilar consolidation/infiltrate.  Significant density remains.  If the patient's clinical findings are improved this may represent improving pneumonia although to help exclude underlying mass, follow up until complete resolution is recommended if it continues to show up after having a chest x-ray again in 2 weeks we are going to get a CT scan of the lungs.  Historical Data    Past Medical History:  Past Medical History:   Diagnosis Date    Anxiety     Cardiac arrhythmia     Chronic obstructive airway disease     Hypercholesterolemia     Hypertension     Hypomagnesemia     Intertrochanteric fracture of left femur     Vitamin D deficiency          Past Surgical History:  Past Surgical History:   Procedure Laterality Date    BRONCHOSCOPY      HX CARPAL TUNNEL RELEASE Bilateral     HX CYSTOCELE REPAIR      HX HIP REPLACEMENT Left     HX HIP REPLACEMENT Left          Allergies:  Allergies[1]  Medications:  Current Outpatient Medications   Medication Sig    albuterol  sulfate (PROVENTIL  OR VENTOLIN  OR PROAIR ) 90 mcg/actuation Inhalation oral inhaler 1-2 Puffs Every 4 hours as needed    albuterol  sulfate (PROVENTIL ) 2.5 mg /3 mL (0.083 %) Inhalation nebulizer solution Take 3 mL (2.5 mg total) by  nebulization Three times a day as needed for Wheezing    BREO ELLIPTA  100-25 mcg/dose Inhalation Disk with Device Take 1 Inhalation by inhalation Daily    busPIRone  (BUSPAR ) 5 mg Oral Tablet TAKE ONE TABLET BY MOUTH THREE TIMES DAILY    ergocalciferol, vitamin D2, (DRISDOL) 1,250 mcg (50,000 unit) Oral Capsule TAKE ONE CAPSULE BY MOUTH once weekly    ipratropium-albuterol  0.5 mg-3 mg(2.5 mg base)/3 mL Solution for Nebulization Take 3 mL by nebulization Every 4 hours as needed for up to 180 days    levoFLOXacin  (LEVAQUIN ) 500 mg Oral Tablet Take 1 Tablet (500 mg total) by mouth Daily Indications: bacterial pneumonia caused by Streptococcus pneumoniae    loperamide  (IMODIUM ) 2 mg Oral Capsule Initial: 4 mg, followed by 2 mg after each loose stool; maximum: 8 mg/day. Limit use to < 48 hours. Indications: diarrhea    magnesium  oxide 400 mg Oral Tablet Take 1 Tablet (400 mg total) by mouth Twice daily    meloxicam  (MOBIC ) 7.5 mg Oral Tablet Take 1 Tablet (7.5 mg total) by mouth Daily for 90 days    metoprolol  tartrate (LOPRESSOR ) 25 mg Oral Tablet Take 1 Tablet (  25 mg total) by mouth Three times a day (Patient taking differently: Take 1 Tablet (25 mg total) by mouth Three times a day as needed for Other IF HEART RATE >80)    ondansetron  (ZOFRAN  ODT) 4 mg Oral Tablet, Rapid Dissolve Take 1 Tablet (4 mg total) by mouth Every 8 hours as needed for Nausea/Vomiting    predniSONE  (DELTASONE ) 20 mg Oral Tablet Take 2.5 Tablets (50 mg total) by mouth Daily for 5 days    promethazine -dextromethorphan (PHENERGAN -DM) 6.25-15 mg/5 mL Oral Syrup Take 5 mL by mouth Four times a day as needed for Cough for up to 6 days    RESTASIS 0.05 % Ophthalmic Dropperette Instill 1 Drop into both eyes Every 12 hours    sertraline  (ZOLOFT ) 100 mg Oral Tablet TAKE ONE TABLET BY MOUTH ONCE DAILY    simvastatin  (ZOCOR ) 40 mg Oral Tablet TAKE ONE TABLET BY MOUTH IN THE EVENING     Family History:  Family Medical History:       Problem Relation (Age of  Onset)    Congestive Heart Failure Mother    Diabetes type II Sister    Prostate Cancer Father    Stroke Sister            Social History:  Social History     Socioeconomic History    Marital status: Widowed   Tobacco Use    Smoking status: Never     Passive exposure: Past (husband was a smoker)    Smokeless tobacco: Never   Vaping Use    Vaping status: Never Used   Substance and Sexual Activity    Alcohol use: Yes     Comment: SOCIAL occasional wine    Drug use: Never           Review of Systems:  Any pertinent Review of Systems as addressed in the HPI above.    Physical Exam:  Vital Signs:  Vitals:    08/18/24 1653   BP: 129/68   Pulse: 78   Resp: 17   Temp: 37.1 C (98.8 F)   TempSrc: Temporal   SpO2: 98%   Weight: 52.6 kg (116 lb)   Height: 1.575 m (5' 2)   BMI: 21.22     Physical Exam  Vitals and nursing note reviewed.   Constitutional:       General: She is awake.      Appearance: Normal appearance. She is well-developed, well-groomed and normal weight.   HENT:      Head: Normocephalic and atraumatic.      Right Ear: Tympanic membrane, ear canal and external ear normal.      Left Ear: Tympanic membrane, ear canal and external ear normal.      Nose: Nose normal.      Mouth/Throat:      Mouth: Mucous membranes are moist.      Pharynx: Oropharynx is clear.   Eyes:      Extraocular Movements: Extraocular movements intact.      Conjunctiva/sclera: Conjunctivae normal.      Pupils: Pupils are equal, round, and reactive to light.   Cardiovascular:      Rate and Rhythm: Normal rate and regular rhythm.      Pulses:           Carotid pulses are 2+ on the right side and 2+ on the left side.       Radial pulses are 2+ on the right side and 2+ on the left side.  Dorsalis pedis pulses are 2+ on the right side and 2+ on the left side.        Posterior tibial pulses are 2+ on the right side and 2+ on the left side.      Heart sounds: Normal heart sounds, S1 normal and S2 normal.   Pulmonary:      Effort: Pulmonary  effort is normal.      Breath sounds: Normal breath sounds. No decreased breath sounds, wheezing, rhonchi or rales.   Abdominal:      General: Abdomen is flat. Bowel sounds are normal.      Palpations: Abdomen is soft.      Tenderness: There is no abdominal tenderness. There is no right CVA tenderness or left CVA tenderness.   Musculoskeletal:         General: Normal range of motion.      Cervical back: Normal range of motion and neck supple.      Right lower leg: No edema.      Left lower leg: No edema.   Skin:     General: Skin is warm and dry.      Capillary Refill: Capillary refill takes less than 2 seconds.   Neurological:      General: No focal deficit present.      Mental Status: She is alert and oriented to person, place, and time. Mental status is at baseline.      Cranial Nerves: Cranial nerves 2-12 are intact.      Sensory: Sensation is intact.      Motor: Motor function is intact.      Coordination: Coordination is intact.      Gait: Gait is intact.   Psychiatric:         Mood and Affect: Mood normal.         Behavior: Behavior normal. Behavior is cooperative.         Thought Content: Thought content normal.         Judgment: Judgment normal.       Assessment:    ICD-10-CM    1. Chronic obstructive pulmonary disease, unspecified COPD type (CMS HCC)  J44.9 XR CHEST PA AND LATERAL      2. Essential hypertension  I10       3. 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 Pneumonia of left lower lobe due to infectious organism  J18.9       5. GAD (generalized anxiety disorder)  F41.1          Plan:  Orders Placed This Encounter    XR CHEST PA AND LATERAL   The patient will be seen back in 2 weeks she will get a chest x-ray.  She has had no fever chills in his has a resumption of her appetite in his less short of breath she is still hypotensive.  she will continue the hold the losartan .  She will also continue to drink 40-45 oz of water  a day she should take Mucinex continue her antibiotic and as she has had no  hemoptysis I do not think at this point we will do anything else.  She may need to see Dr. Tobie again for another bronchoscopy.  She may need a CT scan of the lungs also.  She was a nonsmoker but was exposed to secondary hand smoke    Return in about 6 months (around 02/15/2025).    Layman Law, DO     Portions of this note  may be dictated using voice recognition software or a dictation service. Variances in spelling and vocabulary are possible and unintentional. Not all errors are caught/corrected. Please notify the dino if any discrepancies are noted or if the meaning of any statement is not clear.        [1]   Allergies  Allergen Reactions    Penicillins Nausea/ Vomiting    Sulfa (Sulfonamides) Nausea/ Vomiting

## 2024-08-18 NOTE — Nursing Note (Signed)
 08/18/24 1657   Fall Risk Assessment   Do you feel unsteady when standing or walking? No   Do you worry about falling? No  (USES A CANE)   Have you fallen in the past year? No
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
## 2024-08-27 ENCOUNTER — Ambulatory Visit (INDEPENDENT_AMBULATORY_CARE_PROVIDER_SITE_OTHER): Payer: Self-pay | Admitting: Family Medicine

## 2024-08-30 ENCOUNTER — Other Ambulatory Visit (INDEPENDENT_AMBULATORY_CARE_PROVIDER_SITE_OTHER): Payer: Self-pay | Admitting: Family Medicine

## 2024-09-02 ENCOUNTER — Telehealth (INDEPENDENT_AMBULATORY_CARE_PROVIDER_SITE_OTHER): Payer: Self-pay | Admitting: Family Medicine

## 2024-09-02 ENCOUNTER — Ambulatory Visit (INDEPENDENT_AMBULATORY_CARE_PROVIDER_SITE_OTHER): Payer: Self-pay | Admitting: Family Medicine

## 2024-09-02 NOTE — Telephone Encounter (Signed)
 Received call from pt.'s daughter concerning pt. Medications she is not taking Zoloft  and simvastatin  since 12/25. Pt. Stated that she does not need Zoloft  anymore. She is taking metoprolol  25 mg bid with the Buspar  5 mg bid. Meloxicam  7.5 mg daily in the morning. Per daughter she does not need to take to losartan  any more, pt. BP is stable with the metoprolol  25 mg bid. pt. Has been holding losartan  since 08/18/24.

## 2024-09-07 ENCOUNTER — Ambulatory Visit (INDEPENDENT_AMBULATORY_CARE_PROVIDER_SITE_OTHER): Admitting: Family Medicine

## 2025-02-11 ENCOUNTER — Ambulatory Visit (INDEPENDENT_AMBULATORY_CARE_PROVIDER_SITE_OTHER): Payer: Self-pay | Admitting: Family Medicine
